# Patient Record
Sex: Female | Born: 1970
Health system: Southern US, Community
[De-identification: ages and names within clinical notes are randomized; demographics above are authoritative.]

## PROBLEM LIST (undated history)

## (undated) DIAGNOSIS — J45909 Unspecified asthma, uncomplicated: Secondary | ICD-10-CM

## (undated) DIAGNOSIS — F29 Unspecified psychosis not due to a substance or known physiological condition: Secondary | ICD-10-CM

## (undated) DIAGNOSIS — K529 Noninfective gastroenteritis and colitis, unspecified: Secondary | ICD-10-CM

## (undated) DIAGNOSIS — F32A Depression, unspecified: Secondary | ICD-10-CM

## (undated) DIAGNOSIS — R519 Headache, unspecified: Secondary | ICD-10-CM

## (undated) DIAGNOSIS — F419 Anxiety disorder, unspecified: Secondary | ICD-10-CM

## (undated) DIAGNOSIS — R7303 Prediabetes: Secondary | ICD-10-CM

## (undated) DIAGNOSIS — K5792 Diverticulitis of intestine, part unspecified, without perforation or abscess without bleeding: Secondary | ICD-10-CM

## (undated) DIAGNOSIS — R569 Unspecified convulsions: Secondary | ICD-10-CM

## (undated) DIAGNOSIS — Z8041 Family history of malignant neoplasm of ovary: Secondary | ICD-10-CM

## (undated) DIAGNOSIS — M797 Fibromyalgia: Secondary | ICD-10-CM

## (undated) DIAGNOSIS — T7840XA Allergy, unspecified, initial encounter: Secondary | ICD-10-CM

## (undated) HISTORY — DX: Unspecified convulsions: R56.9

## (undated) HISTORY — DX: Diverticulitis of intestine, part unspecified, without perforation or abscess without bleeding: K57.92

## (undated) HISTORY — DX: Family history of malignant neoplasm of ovary: Z80.41

## (undated) HISTORY — DX: Anxiety disorder, unspecified: F41.9

## (undated) HISTORY — DX: Allergy, unspecified, initial encounter: T78.40XA

## (undated) HISTORY — PX: BACK SURGERY: SHX140

## (undated) HISTORY — DX: Headache, unspecified: R51.9

---

## 2007-10-29 HISTORY — PX: LAMINECTOMY AND MICRODISCECTOMY THORACIC SPINE: SHX1915

## 2010-02-27 HISTORY — PX: ULNAR NERVE REPAIR: SHX2594

## 2010-11-28 HISTORY — PX: BACK SURGERY: SHX140

## 2011-01-24 DIAGNOSIS — F17200 Nicotine dependence, unspecified, uncomplicated: Secondary | ICD-10-CM | POA: Insufficient documentation

## 2011-04-05 DIAGNOSIS — Z7689 Persons encountering health services in other specified circumstances: Secondary | ICD-10-CM | POA: Insufficient documentation

## 2018-04-17 DIAGNOSIS — Z9889 Other specified postprocedural states: Secondary | ICD-10-CM | POA: Insufficient documentation

## 2020-02-28 DIAGNOSIS — M4802 Spinal stenosis, cervical region: Secondary | ICD-10-CM

## 2020-02-28 DIAGNOSIS — M51369 Other intervertebral disc degeneration, lumbar region without mention of lumbar back pain or lower extremity pain: Secondary | ICD-10-CM

## 2020-02-28 DIAGNOSIS — M5030 Other cervical disc degeneration, unspecified cervical region: Secondary | ICD-10-CM

## 2020-02-28 HISTORY — DX: Other cervical disc degeneration, unspecified cervical region: M50.30

## 2020-02-28 HISTORY — DX: Other intervertebral disc degeneration, lumbar region without mention of lumbar back pain or lower extremity pain: M51.369

## 2020-02-28 HISTORY — DX: Spinal stenosis, cervical region: M48.02

## 2020-05-03 ENCOUNTER — Emergency Department
Admission: EM | Admit: 2020-05-03 | Discharge: 2020-05-04 | Disposition: A | Discharge status: Home / Self Care | Payer: PRIVATE HEALTH INSURANCE | Attending: Emergency Medicine | Admitting: Emergency Medicine

## 2020-05-03 ENCOUNTER — Encounter: Payer: Self-pay | Admitting: Emergency Medicine

## 2020-05-03 ENCOUNTER — Other Ambulatory Visit: Payer: Self-pay

## 2020-05-03 DIAGNOSIS — R1032 Left lower quadrant pain: Secondary | ICD-10-CM | POA: Diagnosis not present

## 2020-05-03 DIAGNOSIS — R109 Unspecified abdominal pain: Secondary | ICD-10-CM

## 2020-05-03 DIAGNOSIS — R110 Nausea: Secondary | ICD-10-CM | POA: Insufficient documentation

## 2020-05-03 HISTORY — DX: Noninfective gastroenteritis and colitis, unspecified: K52.9

## 2020-05-03 LAB — CBC
HCT: 44.6 % (ref 36.0–46.0)
Hemoglobin: 14.9 g/dL (ref 12.0–15.0)
MCH: 29.6 pg (ref 26.0–34.0)
MCHC: 33.4 g/dL (ref 30.0–36.0)
MCV: 88.5 fL (ref 80.0–100.0)
Platelets: 276 10*3/uL (ref 150–400)
RBC: 5.04 MIL/uL (ref 3.87–5.11)
RDW: 13.4 % (ref 11.5–15.5)
WBC: 13.1 10*3/uL — ABNORMAL HIGH (ref 4.0–10.5)
nRBC: 0 % (ref 0.0–0.2)

## 2020-05-03 LAB — URINALYSIS, COMPLETE (UACMP) WITH MICROSCOPIC
Bacteria, UA: NONE SEEN
Bilirubin Urine: NEGATIVE
Glucose, UA: NEGATIVE mg/dL
Ketones, ur: NEGATIVE mg/dL
Nitrite: NEGATIVE
Protein, ur: 30 mg/dL — AB
Specific Gravity, Urine: 1.011 (ref 1.005–1.030)
pH: 5 (ref 5.0–8.0)

## 2020-05-03 LAB — COMPREHENSIVE METABOLIC PANEL
ALT: 20 U/L (ref 0–44)
AST: 22 U/L (ref 15–41)
Albumin: 4 g/dL (ref 3.5–5.0)
Alkaline Phosphatase: 69 U/L (ref 38–126)
Anion gap: 9 (ref 5–15)
BUN: 7 mg/dL (ref 6–20)
CO2: 23 mmol/L (ref 22–32)
Calcium: 8.8 mg/dL — ABNORMAL LOW (ref 8.9–10.3)
Chloride: 105 mmol/L (ref 98–111)
Creatinine, Ser: 0.68 mg/dL (ref 0.44–1.00)
GFR, Estimated: >60 mL/min (ref >60)
Glucose, Bld: 104 mg/dL — ABNORMAL HIGH (ref 70–99)
Potassium: 3.5 mmol/L (ref 3.5–5.1)
Sodium: 137 mmol/L (ref 135–145)
Total Bilirubin: 0.6 mg/dL (ref 0.3–1.2)
Total Protein: 7.4 g/dL (ref 6.5–8.1)

## 2020-05-03 MED ORDER — ONDANSETRON HCL 4 MG/2ML IJ SOLN
4.0000 mg | Freq: Once | INTRAMUSCULAR | Status: AC
  Administered 2020-05-04: 4 mg via INTRAVENOUS
  Filled 2020-05-03: qty 2

## 2020-05-03 MED ORDER — MORPHINE SULFATE (PF) 4 MG/ML IV SOLN
4.0000 mg | Freq: Once | INTRAVENOUS | Status: AC
  Administered 2020-05-04: 4 mg via INTRAVENOUS
  Filled 2020-05-03: qty 1

## 2020-05-03 MED ORDER — SODIUM CHLORIDE 0.9 % IV BOLUS (SEPSIS)
1000.0000 mL | Freq: Once | INTRAVENOUS | Status: AC
  Administered 2020-05-04: 1000 mL via INTRAVENOUS

## 2020-05-03 NOTE — ED Triage Notes (Addendum)
Pt in with co left flank pain since Saturday. States hx of kidney infections in the past but states feels different. Denies any dysuria or fever at home. Pt states she has also had some blood stools, hx of ulcerative colitis.

## 2020-05-03 NOTE — ED Provider Notes (Incomplete)
[facility] Emergency Department Provider Note  ____________________________________________   Event Date/Time   First MD Initiated Contact with Patient 05/03/20 2259     (approximate)  I have reviewed the triage vital signs and the nursing notes.   HISTORY  Chief Complaint Flank Pain    HPI Megan Hawkins is a 50 y.o. female with history of ulcerative colitis which she reports has been in remission since she was 50 years old who presents to the emergency department with complaints of left upper quadrant, left lower quadrant and left flank pain that she describes as sharp in nature.  Pain is worse with deep inspiration.  Pain starts in the left flank and radiates into the left abdomen.  She states it is severe in nature but waxes and wanes.  She has had associated nausea but no vomiting or diarrhea.  Reports her stools have appeared normal but she has seen a small amount of bright red blood in the toilet over the past couple of days.  No clots.  No melena.  Denies dysuria, hematuria, vaginal bleeding or discharge.  She denies any chest pain or shortness of breath.  No fever or cough.  No history of PE, DVT, exogenous estrogen use, recent fractures, surgery, trauma, hospitalization, prolonged travel or other immobilization. No lower extremity swelling or pain. No calf tenderness.    She reports she has had 2 previous thoracic spine surgeries, last was in 2012 in [place].  She states she has had a laminectomy, discectomy and then a stabilization.  She denies any numbness, tingling or focal weakness.  No bowel or bladder incontinence, urinary retention.  No history of recent injury to her back.  She reports history of previous C-section.        Past Medical History:  Diagnosis Date  . Colitis     There are no problems to display for this patient.   History reviewed. No pertinent surgical history.  Prior to Admission medications   Not on File     Allergies Sulfa antibiotics  History reviewed. No pertinent family history.  Social History    Review of Systems Constitutional: No fever. Eyes: No visual changes. ENT: No sore throat. Cardiovascular: Denies chest pain. Respiratory: Denies shortness of breath. Gastrointestinal: No vomiting, diarrhea. Genitourinary: Negative for dysuria. Musculoskeletal: Negative for back pain. Skin: Negative for rash. Neurological: Negative for focal weakness or numbness.  ____________________________________________   PHYSICAL EXAM:  VITAL SIGNS: ED Triage Vitals [05/03/20 2217]  Enc Vitals Group     BP (!) 149/75     Pulse Rate 96     Resp 20     Temp 98.5 F (36.9 C)     Temp Source Oral     SpO2 96 %     Weight 185 lb (83.9 kg)     Height 4\' 11"  (1.499 m)     Head Circumference      Peak Flow      Pain Score 7     Pain Loc      Pain Edu?      Excl. in [place]?    CONSTITUTIONAL: Alert and oriented and responds appropriately to questions. Well-appearing; well-nourished HEAD: Normocephalic EYES: Conjunctivae clear, pupils appear equal, EOM appear intact ENT: normal nose; moist mucous membranes NECK: Supple, normal ROM CARD: RRR; S1 and S2 appreciated; no murmurs, no clicks, no rubs, no gallops RESP: Normal chest excursion without splinting or tachypnea; breath sounds clear and equal bilaterally; no wheezes, no rhonchi, no  rales, no hypoxia or respiratory distress, speaking full sentences ABD/GI: Normal bowel sounds; non-distended; soft, tender throughout the left upper quadrant and minimally in the left lower quadrant, no rebound, no guarding, no peritoneal signs, no hepatosplenomegaly BACK: The back appears normal, no midline spinal tenderness or step-off or deformity, patient has some mild left CVA tenderness, no redness or warmth, soft tissue swelling or ecchymosis, rash or other lesions present to the back EXT: Normal ROM in all joints; no deformity noted, no edema; no  cyanosis SKIN: Normal color for age and race; warm; no rash on exposed skin NEURO: Moves all extremities equally, normal sensation in bilateral lower extremities, no saddle anesthesia, no hyperreflexia, 2+ deep tendon reflexes in bilateral lower extremities PSYCH: The patient's mood and manner are appropriate.  ____________________________________________   LABS (all labs ordered are listed, but only abnormal results are displayed)  Labs Reviewed  CBC - Abnormal; Notable for the following components:      Result Value   WBC 13.1 (*)    All other components within normal limits  COMPREHENSIVE METABOLIC PANEL - Abnormal; Notable for the following components:   Glucose, Bld 104 (*)    Calcium 8.8 (*)    All other components within normal limits  URINALYSIS, COMPLETE (UACMP) WITH MICROSCOPIC - Abnormal; Notable for the following components:   Color, Urine YELLOW (*)    APPearance HAZY (*)    Hgb urine dipstick SMALL (*)    Protein, ur 30 (*)    Leukocytes,Ua TRACE (*)    All other components within normal limits   ____________________________________________  EKG  *** ____________________________________________  RADIOLOGY I, Kristen Ward, personally viewed and evaluated these images (plain radiographs) as part of my medical decision making, as well as reviewing the written report by the radiologist.  ED MD interpretation:  ***  Official radiology report(s): No results found.  ____________________________________________   PROCEDURES  Procedure(s) performed (including Critical Care):  Procedures  CRITICAL CARE Performed by: Pryor Curia   Total critical care time: *** minutes  Critical care time was exclusive of separately billable procedures and treating other patients.  Critical care was necessary to treat or prevent imminent or life-threatening deterioration.  Critical care was time spent personally by me on the following activities: development of  treatment plan with patient and/or surrogate as well as nursing, discussions with consultants, evaluation of patient's response to treatment, examination of patient, obtaining history from patient or surrogate, ordering and performing treatments and interventions, ordering and review of laboratory studies, ordering and review of radiographic studies, pulse oximetry and re-evaluation of patient's condition.  ____________________________________________   INITIAL IMPRESSION / ASSESSMENT AND PLAN / ED COURSE  As part of my medical decision making, I reviewed the following data within the [place] {Mdm:[id number]::"Notes from prior ED visits","[place] Controlled Substance Database"}         Patient here with complaints of left-sided abdominal pain, left flank pain.  Differential includes kidney stone, pyelonephritis, colitis, diverticulitis.  Less likely appendicitis.  She does state there is increasing pain with deep inspiration but she denies having any chest pain or shortness of breath.  Will check EKG but low suspicion that this is her anginal equivalent.  She has no risk factors for PE and she is not tachycardic, tachypneic or hypoxic.  She does have history of previous back surgery but no red flag symptoms to suggest cauda equina, epidural abscess or hematoma, discitis or osteomyelitis, transverse myelitis.  She is neurologically intact at this  time.  Labs, urine obtained in triage unremarkable other than leukocytosis of 13,000.  We will proceed with CT imaging of her abdomen pelvis.  Will give IV fluids, pain and nausea medicine.  ED PROGRESS  *** ____________________________________________   FINAL CLINICAL IMPRESSION(S) / ED DIAGNOSES  Final diagnoses:  None     ED Discharge Orders    None      *Please note:  Megan Hawkins was evaluated in Emergency Department on 05/03/2020 for the symptoms described in the history of present illness. She was evaluated in the context of  the global COVID-19 pandemic, which necessitated consideration that the patient might be at risk for infection with the SARS-CoV-2 virus that causes COVID-19. Institutional protocols and algorithms that pertain to the evaluation of patients at risk for COVID-19 are in a state of rapid change based on information released by regulatory bodies including the CDC and federal and state organizations. These policies and algorithms were followed during the patient's care in the ED.  Some ED evaluations and interventions may be delayed as a result of limited staffing during and the pandemic.*   Note:  This document was prepared using Dragon voice recognition software and may include unintentional dictation errors.

## 2020-05-04 ENCOUNTER — Emergency Department: Payer: PRIVATE HEALTH INSURANCE

## 2020-05-04 MED ORDER — IOHEXOL 300 MG/ML  SOLN
100.0000 mL | Freq: Once | INTRAMUSCULAR | Status: AC | PRN
  Administered 2020-05-04: 100 mL via INTRAVENOUS

## 2020-05-04 MED ORDER — IBUPROFEN 800 MG PO TABS: 800.0000 mg | ORAL_TABLET | Freq: Three times a day (TID) | ORAL | 0 refills | Status: AC | PRN

## 2020-05-04 MED ORDER — HYDROCODONE-ACETAMINOPHEN 5-325 MG PO TABS: 2.0000 | ORAL_TABLET | Freq: Four times a day (QID) | ORAL | 0 refills | Status: DC | PRN

## 2020-05-04 MED ORDER — ONDANSETRON 4 MG PO TBDP: 4.0000 mg | ORAL_TABLET | Freq: Four times a day (QID) | ORAL | 0 refills | Status: DC | PRN

## 2020-05-04 NOTE — ED Notes (Signed)
Patient transported to CT 

## 2020-05-04 NOTE — ED Notes (Signed)
Pt agreeable with d/c plan as discussed by provider- this nurse has verbally reinforced d/c instructions and provided pt with written copy - pt acknowledges verbal understanding and denies any additional questions, concerns, needs.  Ambulatory at discharge independently with steady gait- awaits ride in front of ED entrance; ride enroute- pt declined to wait in lobby

## 2020-05-04 NOTE — ED Notes (Signed)
Late entry -- Pt has returned from [place] via stretcher; no acute changes noted.

## 2020-05-04 NOTE — Discharge Instructions (Addendum)
Your pain may be secondary to a pulled, strained muscle.  Your labs, urine, EKG and CT scan today were normal.  If you begin developing chest pain, shortness of breath, numbness or weakness in your extremities, inability to hold your bowel or bladder, inability to empty your bladder, fever of 100.4 or higher, vomiting and cannot stop, please return to the emergency department.  Steps to find a Primary Care Provider (PCP):  Call [phone] or [phone] to access "[place] [facility]."  2.  You may also go on the [facility] website at [email or web address]   You are being provided a prescription for opiates (also known as narcotics) for pain control.  Opiates can be addictive and should only be used when absolutely necessary for pain control when other alternatives do not work.  We recommend you only use them for the recommended amount of time and only as prescribed.  Please do not take with other sedative medications or alcohol.  Please do not drive, operate machinery, make important decisions while taking opiates.  Please note that these medications can be addictive and have high abuse potential.  Patients can become addicted to narcotics after only taking them for a few days.  Please keep these medications locked away from children, teenagers or any family members with history of substance abuse.  Narcotic pain medicine may also make you constipated.  You may use over-the-counter medications such as MiraLAX, Colace to prevent constipation.  If you become constipated you may use over-the-counter enemas as needed.  Itching and nausea are common side effects of narcotic pain medication.  If you develop uncontrolled vomiting or a rash, please stop these medications.

## 2020-05-04 NOTE — ED Provider Notes (Signed)
[facility] Emergency Department Provider Note  ____________________________________________   Event Date/Time   First MD Initiated Contact with Patient 05/03/20 2259     (approximate)  I have reviewed the triage vital signs and the nursing notes.   HISTORY  Chief Complaint Flank Pain    HPI Megan Hawkins is a 50 y.o. female with history of ulcerative colitis which she reports has been in remission since she was 50 years old who presents to the emergency department with complaints of left upper quadrant, left lower quadrant and left flank pain that she describes as sharp in nature.  Pain is worse with deep inspiration, turning to the left side.  Pain starts in the left flank and radiates into the left abdomen.  She states it is severe in nature but waxes and wanes.  She has had associated nausea but no vomiting or diarrhea.  Reports her stools have appeared normal but she has seen a small amount of bright red blood in the toilet over the past couple of days.  No clots.  No melena.  Denies dysuria, hematuria, vaginal bleeding or discharge.  She denies any chest pain or shortness of breath.  No fever or cough.  No history of PE, DVT, exogenous estrogen use, recent fractures, surgery, trauma, hospitalization, prolonged travel or other immobilization. No lower extremity swelling or pain. No calf tenderness. She reports she has had 2 previous thoracic spine surgeries, last was in 2012 in [place].  She states she has had a laminectomy, discectomy and then a stabilization.  She denies any numbness, tingling or focal weakness.  No bowel or bladder incontinence, urinary retention.  No history of recent injury to her back.  She reports history of previous C-section.        Past Medical History:  Diagnosis Date  . Colitis     There are no problems to display for this patient.   Past Surgical History:  Procedure Laterality Date  . CESAREAN SECTION      Prior to  Admission medications   Medication Sig Start Date End Date Taking? Authorizing Provider  HYDROcodone-acetaminophen (NORCO/VICODIN) 5-325 MG tablet Take 2 tablets by mouth every 6 (six) hours as needed. 05/04/20  Yes Daenerys Buttram, Delice Bison, DO  ibuprofen (ADVIL) 800 MG tablet Take 1 tablet (800 mg total) by mouth every 8 (eight) hours as needed for mild pain. 05/04/20  Yes Ariell Gunnels N, DO  ondansetron (ZOFRAN ODT) 4 MG disintegrating tablet Take 1 tablet (4 mg total) by mouth every 6 (six) hours as needed for nausea or vomiting. 05/04/20  Yes Sharleen Szczesny, Delice Bison, DO    Allergies Sulfa antibiotics  History reviewed. No pertinent family history.  Social History    Review of Systems Constitutional: No fever. Eyes: No visual changes. ENT: No sore throat. Cardiovascular: Denies chest pain. Respiratory: Denies shortness of breath. Gastrointestinal: No vomiting, diarrhea. Genitourinary: Negative for dysuria. Musculoskeletal: Negative for back pain. Skin: Negative for rash. Neurological: Negative for focal weakness or numbness.  ____________________________________________   PHYSICAL EXAM:  VITAL SIGNS: ED Triage Vitals [05/03/20 2217]  Enc Vitals Group     BP (!) 149/75     Pulse Rate 96     Resp 20     Temp 98.5 F (36.9 C)     Temp Source Oral     SpO2 96 %     Weight 185 lb (83.9 kg)     Height 4\' 11"  (1.499 m)     Head Circumference  Peak Flow      Pain Score 7     Pain Loc      Pain Edu?      Excl. in [place]?    CONSTITUTIONAL: Alert and oriented and responds appropriately to questions. Well-appearing; well-nourished HEAD: Normocephalic EYES: Conjunctivae clear, pupils appear equal, EOM appear intact ENT: normal nose; moist mucous membranes NECK: Supple, normal ROM CARD: RRR; S1 and S2 appreciated; no murmurs, no clicks, no rubs, no gallops RESP: Normal chest excursion without splinting or tachypnea; breath sounds clear and equal bilaterally; no wheezes, no rhonchi, no rales,  no hypoxia or respiratory distress, speaking full sentences ABD/GI: Normal bowel sounds; non-distended; soft, tender throughout the left upper quadrant and minimally in the left lower quadrant, no rebound, no guarding, no peritoneal signs, no hepatosplenomegaly BACK: The back appears normal, no midline spinal tenderness or step-off or deformity, patient has some mild left CVA tenderness, no redness or warmth, soft tissue swelling or ecchymosis, rash or other lesions present to the back EXT: Normal ROM in all joints; no deformity noted, no edema; no cyanosis SKIN: Normal color for age and race; warm; no rash on exposed skin NEURO: Moves all extremities equally, normal sensation in bilateral lower extremities, no saddle anesthesia, no hyperreflexia, 2+ deep tendon reflexes in bilateral lower extremities PSYCH: The patient's mood and manner are appropriate.  ____________________________________________   LABS (all labs ordered are listed, but only abnormal results are displayed)  Labs Reviewed  CBC - Abnormal; Notable for the following components:      Result Value   WBC 13.1 (*)    All other components within normal limits  COMPREHENSIVE METABOLIC PANEL - Abnormal; Notable for the following components:   Glucose, Bld 104 (*)    Calcium 8.8 (*)    All other components within normal limits  URINALYSIS, COMPLETE (UACMP) WITH MICROSCOPIC - Abnormal; Notable for the following components:   Color, Urine YELLOW (*)    APPearance HAZY (*)    Hgb urine dipstick SMALL (*)    Protein, ur 30 (*)    Leukocytes,Ua TRACE (*)    All other components within normal limits   ____________________________________________  EKG   EKG Interpretation  Date/Time:  Monday May 03 2020 23:56:52 EST Ventricular Rate:  74 PR Interval:    QRS Duration: 90 QT Interval:  397 QTC Calculation: 441 R Axis:   66 Text Interpretation: Sinus rhythm No old tracing to compare Confirmed by Pryor Curia [phone]) on  05/04/2020 12:18:45 AM       ____________________________________________  RADIOLOGY Jessie Foot Diva Lemberger, personally viewed and evaluated these images (plain radiographs) as part of my medical decision making, as well as reviewing the written report by the radiologist.  ED MD interpretation: No acute abnormality seen on CT imaging.  Official radiology report(s): CT ABDOMEN PELVIS W CONTRAST  Result Date: 05/04/2020 CLINICAL DATA:  Left-sided abdominal pain for several days EXAM: CT ABDOMEN AND PELVIS WITH CONTRAST TECHNIQUE: Multidetector CT imaging of the abdomen and pelvis was performed using the standard protocol following bolus administration of intravenous contrast. CONTRAST:  142mL OMNIPAQUE IOHEXOL 300 MG/ML  SOLN COMPARISON:  None. FINDINGS: Lower chest: No acute abnormality. Hepatobiliary: No focal liver abnormality is seen. No gallstones, gallbladder wall thickening, or biliary dilatation. Pancreas: Unremarkable. No pancreatic ductal dilatation or surrounding inflammatory changes. Spleen: Normal in size without focal abnormality. Adrenals/Urinary Tract: Adrenal glands are within normal limits. Kidneys are well visualized bilaterally. No renal calculi or urinary tract obstructive changes are seen.  Bladder is well distended and within normal limits. Stomach/Bowel: Scattered diverticular change of the colon is noted without evidence of diverticulitis. No obstructive or inflammatory changes are seen. The appendix is within normal limits. Small bowel and stomach show no acute abnormality Vascular/Lymphatic: Aortic atherosclerosis. No enlarged abdominal or pelvic lymph nodes. Reproductive: Uterus and bilateral adnexa are unremarkable. Other: No abdominal wall hernia or abnormality. No abdominopelvic ascites. Musculoskeletal: Postsurgical changes are noted in the lower thoracic spine on the left. No acute bony abnormality is seen. IMPRESSION: Diverticulosis without diverticulitis. No other focal  abnormality is noted. Electronically Signed   By: Inez Catalina M.D.   On: 05/04/2020 00:51    ____________________________________________   PROCEDURES  Procedure(s) performed (including Critical Care):  Procedures    ____________________________________________   INITIAL IMPRESSION / ASSESSMENT AND PLAN / ED COURSE  As part of my medical decision making, I reviewed the following data within the [place] notes reviewed and incorporated, Labs reviewed, EKG interpreted NSR, CT reviewed, Notes from prior ED visits and [place] Controlled Substance Database         Patient here with complaints of left-sided abdominal pain, left flank pain.  Differential includes kidney stone, pyelonephritis, colitis, diverticulitis.  Less likely appendicitis.  She does state there is increasing pain with deep inspiration but she denies having any chest pain or shortness of breath.  Will check EKG but low suspicion that this is her anginal equivalent.  She has no risk factors for PE and she is not tachycardic, tachypneic or hypoxic.  She does have history of previous back surgery but no red flag symptoms to suggest cauda equina, epidural abscess or hematoma, discitis or osteomyelitis, transverse myelitis.  She is neurologically intact at this time.  Labs, urine obtained in triage unremarkable other than leukocytosis of 13,000.  We will proceed with CT imaging of her abdomen pelvis.  Will give IV fluids, pain and nausea medicine.  ED PROGRESS  Patient CT scan shows diverticulosis without diverticulitis.  Appendix appears normal.  No sign of colitis, bowel obstruction.  Postsurgical changes noted in the lower thoracic spine but no acute bony abnormality seen.  No kidney stones, pyelonephritis.  Lower lungs appear normal.  I suspect that her pain is musculoskeletal in nature.  Will discharge with pain medication.  She reports her pain is improved here.  Recommended follow-up with her primary  care doctor if symptoms or not improving.  We discussed return precautions.  She is comfortable with this plan and will have her boyfriend drive her home.  At this time, I do not feel there is any life-threatening condition present. I have reviewed, interpreted and discussed all results (EKG, imaging, lab, urine as appropriate) and exam findings with patient/family. I have reviewed nursing notes and appropriate previous records.  I feel the patient is safe to be discharged home without further emergent workup and can continue workup as an outpatient as needed. Discussed usual and customary return precautions. Patient/family verbalize understanding and are comfortable with this plan.  Outpatient follow-up has been provided as needed. All questions have been answered.  ____________________________________________   FINAL CLINICAL IMPRESSION(S) / ED DIAGNOSES  Final diagnoses:  Left flank pain     ED Discharge Orders         Ordered    HYDROcodone-acetaminophen (NORCO/VICODIN) 5-325 MG tablet  Every 6 hours PRN        05/04/20 0133    ibuprofen (ADVIL) 800 MG tablet  Every 8 hours PRN  05/04/20 0133    ondansetron (ZOFRAN ODT) 4 MG disintegrating tablet  Every 6 hours PRN        05/04/20 0133          *Please note:  Shay Jhaveri was evaluated in Emergency Department on 05/04/2020 for the symptoms described in the history of present illness. She was evaluated in the context of the global COVID-19 pandemic, which necessitated consideration that the patient might be at risk for infection with the SARS-CoV-2 virus that causes COVID-19. Institutional protocols and algorithms that pertain to the evaluation of patients at risk for COVID-19 are in a state of rapid change based on information released by regulatory bodies including the CDC and federal and state organizations. These policies and algorithms were followed during the patient's care in the ED.  Some ED evaluations and interventions  may be delayed as a result of limited staffing during and the pandemic.*   Note:  This document was prepared using Dragon voice recognition software and may include unintentional dictation errors.   Yalissa Fink, Delice Bison, DO 05/04/20 [phone]

## 2020-05-07 ENCOUNTER — Ambulatory Visit
Admission: EM | Admit: 2020-05-07 | Discharge: 2020-05-07 | Disposition: A | Discharge status: Home / Self Care | Payer: PRIVATE HEALTH INSURANCE | Attending: Family Medicine | Admitting: Family Medicine

## 2020-05-07 ENCOUNTER — Other Ambulatory Visit: Payer: Self-pay

## 2020-05-07 ENCOUNTER — Encounter: Payer: Self-pay | Admitting: Family Medicine

## 2020-05-07 DIAGNOSIS — K5900 Constipation, unspecified: Secondary | ICD-10-CM | POA: Diagnosis present

## 2020-05-07 DIAGNOSIS — R1084 Generalized abdominal pain: Secondary | ICD-10-CM | POA: Diagnosis present

## 2020-05-07 DIAGNOSIS — R109 Unspecified abdominal pain: Secondary | ICD-10-CM | POA: Diagnosis not present

## 2020-05-07 LAB — POCT URINALYSIS DIP (MANUAL ENTRY)
Bilirubin, UA: NEGATIVE
Glucose, UA: NEGATIVE mg/dL
Ketones, POC UA: NEGATIVE mg/dL
Leukocytes, UA: NEGATIVE
Nitrite, UA: NEGATIVE
Protein Ur, POC: NEGATIVE mg/dL
Spec Grav, UA: 1.01 (ref 1.010–1.025)
Urobilinogen, UA: 0.2 E.U./dL
pH, UA: 5.5 (ref 5.0–8.0)

## 2020-05-07 NOTE — Discharge Instructions (Addendum)
I believe that you are constipated Urine without infection here.  Try some miralax, increase water.  You can do the miralax morning and evening until a good bowel movement.  The hydrocodone will make you more constipated. Recommend discontinuing this If problem persists ir worsens please follow up.

## 2020-05-07 NOTE — ED Provider Notes (Signed)
Roderic Palau    CSN: [id number] Arrival date & time: 05/07/20  1207      History   Chief Complaint No chief complaint on file.   HPI Megan Hawkins is a 50 y.o. female.   Patient is a 2-year female that presents today with left flank pain with radiation to left upper quadrant and left lower abdominal discomfort.  This is been constant, waxing waning for the past 4 to 5 days.  Was seen in the emergency room on Monday and had work-up to include CT scan, blood work and urinalysis without any concerns.  CT showed diverticulosis without diverticulitis but no evidence of kidney stone.  She denies any urinary symptoms.  Reports that she has not had a bowel movement since last Sunday.  She has been taking Colace.  She is also been taking Zofran and hydrocodone that was prescribed in the hospital.  She is able to drink without any difficulties.  No vomiting or diarrhea.  No fevers. No chest pain, SOB.      Past Medical History:  Diagnosis Date  . Colitis     There are no problems to display for this patient.   Past Surgical History:  Procedure Laterality Date  . CESAREAN SECTION      OB History   No obstetric history on file.      Home Medications    Prior to Admission medications   Medication Sig Start Date End Date Taking? Authorizing Provider  HYDROcodone-acetaminophen (NORCO/VICODIN) 5-325 MG tablet Take 2 tablets by mouth every 6 (six) hours as needed. 05/04/20   Ward, Delice Bison, DO  ibuprofen (ADVIL) 800 MG tablet Take 1 tablet (800 mg total) by mouth every 8 (eight) hours as needed for mild pain. 05/04/20   Ward, Delice Bison, DO  ondansetron (ZOFRAN ODT) 4 MG disintegrating tablet Take 1 tablet (4 mg total) by mouth every 6 (six) hours as needed for nausea or vomiting. 05/04/20   Ward, Delice Bison, DO    Family History History reviewed. No pertinent family history.  Social History     Allergies   Sulfa antibiotics   Review of Systems Review of  Systems   Physical Exam Triage Vital Signs ED Triage Vitals [05/07/20 1242]  Enc Vitals Group     BP 113/75     Pulse Rate 95     Resp 18     Temp 98.2 F (36.8 C)     Temp Source Oral     SpO2 96 %     Weight      Height      Head Circumference      Peak Flow      Pain Score      Pain Loc      Pain Edu?      Excl. in [place]?    No data found.  Updated Vital Signs BP 113/75   Pulse 95   Temp 98.2 F (36.8 C) (Oral)   Resp 18   SpO2 96%   Visual Acuity Right Eye Distance:   Left Eye Distance:   Bilateral Distance:    Right Eye Near:   Left Eye Near:    Bilateral Near:     Physical Exam Vitals and nursing note reviewed.  Constitutional:      General: She is not in acute distress.    Appearance: Normal appearance. She is not ill-appearing, toxic-appearing or diaphoretic.  HENT:     Head: Normocephalic.     Nose:  Nose normal.     Mouth/Throat:     Pharynx: Oropharynx is clear.  Eyes:     Conjunctiva/sclera: Conjunctivae normal.  Pulmonary:     Effort: Pulmonary effort is normal.  Abdominal:     General: Abdomen is protuberant. Bowel sounds are normal. There is no distension.     Palpations: Abdomen is soft. There is no hepatomegaly or splenomegaly.     Tenderness: There is generalized abdominal tenderness and tenderness in the left upper quadrant. There is left CVA tenderness. There is no guarding or rebound.     Hernia: No hernia is present.  Musculoskeletal:        General: Normal range of motion.     Cervical back: Normal range of motion.  Skin:    General: Skin is warm and dry.     Findings: No rash.  Neurological:     Mental Status: She is alert.  Psychiatric:        Mood and Affect: Mood normal.      UC Treatments / Results  Labs (all labs ordered are listed, but only abnormal results are displayed) Labs Reviewed  POCT URINALYSIS DIP (MANUAL ENTRY) - Abnormal; Notable for the following components:      Result Value   Blood, UA small (*)     All other components within normal limits  URINE CULTURE    EKG   Radiology No results found.  Procedures Procedures (including critical care time)  Medications Ordered in UC Medications - No data to display  Initial Impression / Assessment and Plan / UC Course  I have reviewed the triage vital signs and the nursing notes.  Pertinent labs & imaging results that were available during my care of the patient were reviewed by me and considered in my medical decision making (see chart for details).     Flank pain with generalized abdominal discomfort and constipation. Urine without any concern for infection today.  Patient has already had a work-up in the ER to include CT scan and blood work on Monday which was negative for anything concerning.  Most likely symptoms related to constipation.  Recommended MiraLAX 2 times a day or daily until good bowel movement.  Increase water intake.  Recommended DC the hydrocodone which she reports she has finished.  Strict return and ER precautions given.  Final Clinical Impressions(s) / UC Diagnoses   Final diagnoses:  Flank pain  Generalized abdominal pain  Constipation, unspecified constipation type     Discharge Instructions     I believe that you are constipated Urine without infection here.  Try some miralax, increase water.  You can do the miralax morning and evening until a good bowel movement.  The hydrocodone will make you more constipated. Recommend discontinuing this If problem persists ir worsens please follow up.    ED Prescriptions    None     PDMP not reviewed this encounter.   Orvan July, NP 05/07/20 1417

## 2020-05-07 NOTE — ED Triage Notes (Signed)
Triaged by provider  

## 2020-05-09 LAB — URINE CULTURE: Culture: NO GROWTH

## 2020-05-13 ENCOUNTER — Ambulatory Visit: Payer: Self-pay | Admitting: Internal Medicine

## 2020-07-27 ENCOUNTER — Emergency Department: Payer: PRIVATE HEALTH INSURANCE

## 2020-07-27 ENCOUNTER — Other Ambulatory Visit: Payer: Self-pay

## 2020-07-27 ENCOUNTER — Emergency Department
Admission: EM | Admit: 2020-07-27 | Discharge: 2020-07-27 | Disposition: A | Discharge status: Home / Self Care | Payer: PRIVATE HEALTH INSURANCE | Attending: Emergency Medicine | Admitting: Emergency Medicine

## 2020-07-27 DIAGNOSIS — R42 Dizziness and giddiness: Secondary | ICD-10-CM | POA: Insufficient documentation

## 2020-07-27 DIAGNOSIS — R110 Nausea: Secondary | ICD-10-CM | POA: Insufficient documentation

## 2020-07-27 DIAGNOSIS — R002 Palpitations: Secondary | ICD-10-CM | POA: Insufficient documentation

## 2020-07-27 DIAGNOSIS — F17210 Nicotine dependence, cigarettes, uncomplicated: Secondary | ICD-10-CM | POA: Insufficient documentation

## 2020-07-27 LAB — PROTIME-INR
INR: 1 (ref 0.8–1.2)
Prothrombin Time: 13.3 seconds (ref 11.4–15.2)

## 2020-07-27 LAB — BASIC METABOLIC PANEL
Anion gap: 9 (ref 5–15)
BUN: 10 mg/dL (ref 6–20)
CO2: 24 mmol/L (ref 22–32)
Calcium: 8.8 mg/dL — ABNORMAL LOW (ref 8.9–10.3)
Chloride: 106 mmol/L (ref 98–111)
Creatinine, Ser: 0.76 mg/dL (ref 0.44–1.00)
GFR, Estimated: >60 mL/min (ref >60)
Glucose, Bld: 116 mg/dL — ABNORMAL HIGH (ref 70–99)
Potassium: 3.8 mmol/L (ref 3.5–5.1)
Sodium: 139 mmol/L (ref 135–145)

## 2020-07-27 LAB — CBC
HCT: 45.1 % (ref 36.0–46.0)
Hemoglobin: 15.5 g/dL — ABNORMAL HIGH (ref 12.0–15.0)
MCH: 29.2 pg (ref 26.0–34.0)
MCHC: 34.4 g/dL (ref 30.0–36.0)
MCV: 85.1 fL (ref 80.0–100.0)
Platelets: 259 10*3/uL (ref 150–400)
RBC: 5.3 MIL/uL — ABNORMAL HIGH (ref 3.87–5.11)
RDW: 13.2 % (ref 11.5–15.5)
WBC: 9.2 10*3/uL (ref 4.0–10.5)
nRBC: 0 % (ref 0.0–0.2)

## 2020-07-27 LAB — TROPONIN I (HIGH SENSITIVITY): Troponin I (High Sensitivity): 3 ng/L (ref <18)

## 2020-07-27 NOTE — ED Provider Notes (Signed)
[facility] Emergency Department Provider Note ____________________________________________   Event Date/Time   First MD Initiated Contact with Patient 07/27/20 1035     (approximate)  I have reviewed the triage vital signs and the nursing notes.  HISTORY  Chief Complaint Palpitations   HPI Megan Hawkins is a 50 y.o. femalewho presents to the ED for evaluation of palpitations.   Chart review indicates hx obesity.   Patient presents to the ED for evaluation of 3 days of palpitations.  Patient reports starting a new job 2 weeks ago, has been having less sleep the past week or 2.  Reports drinking typical 1 cup of coffee every morning without changes to caffeine intake, denies additional stimulants.  Reports 2 days of persistent palpitations without chest pain, syncope, shortness of breath, fever, emesis or abdominal pain.  Reports going to an urgent care this morning and be directed to the ED for further evaluation.  She shows me the EKG done at urgent care which shows a sinus rhythm with sinus arrhythmia, rate of 82 bpm.  Of note, first nurse notes that it showed A. fib and it does not.   Past Medical History:  Diagnosis Date  . Colitis     There are no problems to display for this patient.   Past Surgical History:  Procedure Laterality Date  . CESAREAN SECTION      Prior to Admission medications   Medication Sig Start Date End Date Taking? Authorizing Provider  ibuprofen (ADVIL) 800 MG tablet Take 1 tablet (800 mg total) by mouth every 8 (eight) hours as needed for mild pain. 05/04/20   Ward, Delice Bison, DO    Allergies Sulfa antibiotics  No family history on file.  Social History Social History   Tobacco Use  . Smoking status: Current Every Day Smoker    Types: Cigarettes  . Smokeless tobacco: Never Used  Substance Use Topics  . Alcohol use: Not Currently  . Drug use: Not Currently    Review of Systems  Constitutional: No  fever/chills Eyes: No visual changes. ENT: No sore throat. Cardiovascular: Denies chest pain.  Positive for palpitations Respiratory: Denies shortness of breath. Gastrointestinal: No abdominal pain.  , no vomiting.  No diarrhea.  No constipation. Genitourinary: Negative for dysuria. Musculoskeletal: Negative for back pain. Skin: Negative for rash. Neurological: Negative for headaches, focal weakness or numbness.  ____________________________________________   PHYSICAL EXAM:  VITAL SIGNS: Vitals:   07/27/20 1029  BP: 128/64  Pulse: 88  Resp: 15  Temp: 98.1 F (36.7 C)  SpO2: 98%     Constitutional: Alert and oriented. Well appearing and in no acute distress.  Sitting up on the foot of the bed, conversational and well-appearing. Eyes: Conjunctivae are normal. PERRL. EOMI. Head: Atraumatic. Nose: No congestion/rhinnorhea. Mouth/Throat: Mucous membranes are moist.  Oropharynx non-erythematous. Neck: No stridor. No cervical spine tenderness to palpation. Cardiovascular: Normal rate, regular rhythm. Grossly normal heart sounds.  Good peripheral circulation. Respiratory: Normal respiratory effort.  No retractions. Lungs CTAB. Gastrointestinal: Soft , nondistended, nontender to palpation. No CVA tenderness. Musculoskeletal: No lower extremity tenderness nor edema.  No joint effusions. No signs of acute trauma. Neurologic:  Normal speech and language. No gross focal neurologic deficits are appreciated. No gait instability noted. Skin:  Skin is warm, dry and intact. No rash noted. Psychiatric: Mood and affect are normal. Speech and behavior are normal.  ____________________________________________   LABS (all labs ordered are listed, but only abnormal results are displayed)  Labs  Reviewed  BASIC METABOLIC PANEL - Abnormal; Notable for the following components:      Result Value   Glucose, Bld 116 (*)    Calcium 8.8 (*)    All other components within normal limits  CBC -  Abnormal; Notable for the following components:   RBC 5.30 (*)    Hemoglobin 15.5 (*)    All other components within normal limits  PROTIME-INR  MAGNESIUM  POC URINE PREG, ED  TROPONIN I (HIGH SENSITIVITY)   ____________________________________________  12 Lead EKG  Sinus rhythm, rate of 84 bpm.  Normal axis and intervals.  No evidence of acute ischemia. ____________________________________________  RADIOLOGY  ED MD interpretation: 2 view CXR reviewed by me without evidence of acute cardiopulmonary pathology.  Official radiology report(s): DG Chest 2 View  Result Date: 07/27/2020 CLINICAL DATA:  50 year old female with palpitations. Nausea and dizziness. Smoker. EXAM: CHEST - 2 VIEW COMPARISON:  CT Abdomen and Pelvis 05/04/2020. FINDINGS: Normal cardiac size and mediastinal contours. Normal lung volumes. Mild diffuse increased pulmonary interstitial markings, with subtle attenuation of bronchovascular markings in the upper lobes. No pneumothorax, pleural effusion, pulmonary edema or confluent pulmonary opacity. Redemonstrated unilateral left lower thoracic pedicle screw fixation. No acute osseous abnormality identified. Negative visible bowel gas pattern. IMPRESSION: Probable smoking related mild diffuse interstitial prominence. No acute cardiopulmonary abnormality. Electronically Signed   By: Genevie Ann M.D.   On: 07/27/2020 10:51    ____________________________________________   PROCEDURES and INTERVENTIONS  Procedure(s) performed (including Critical Care):  .1-3 Lead EKG Interpretation Performed by: Vladimir Crofts, MD Authorized by: Vladimir Crofts, MD     Interpretation: normal     ECG rate:  80   ECG rate assessment: normal     Rhythm: sinus rhythm     Ectopy: none     Conduction: normal      Medications - No data to display  ____________________________________________   MDM / ED COURSE   50 year old woman presents to the ED with isolated palpitations, without  evidence of acute pathology, and amenable to outpatient management.  Normal vitals on room air.  Exam demonstrates a well-appearing woman without evidence of acute derangements.  EKG demonstrates normal sinus rhythm without ischemic changes.  Troponin is negative and electrolytes are WNL.  Likely related to her poor sleep in the setting of her new job and associated stressors.  We discussed appropriate sleep hygiene, provided information to establish with a local PCP, and we discussed return precautions for the ED.  Patient stable for outpatient management.      ____________________________________________   FINAL CLINICAL IMPRESSION(S) / ED DIAGNOSES  Final diagnoses:  Palpitations     ED Discharge Orders    None       Megan Hawkins   Note:  This document was prepared using Dragon voice recognition software and may include unintentional dictation errors.   Vladimir Crofts, MD 07/27/20 [phone]

## 2020-07-27 NOTE — ED Notes (Signed)
See triage note  Presents with some nausea and dizziness  Also has had some intermittent heart palpitations  EKG on arrival shows NSR

## 2020-07-27 NOTE — ED Triage Notes (Signed)
Pt sent from [facility] with c/o having heart palpitations for the past 2-3 days, denies pain, SOB or diaphoresis.Megan Hawkins pt is in NAD at present.

## 2020-07-27 NOTE — Discharge Instructions (Signed)
This may be related to poor sleep in the setting of your new job.  No signs of strain or damage to your heart.  If you develop palpitations with chest pain, palpitations with passing out or difficulty breathing, please return to the ED.

## 2020-07-27 NOTE — ED Triage Notes (Signed)
First Nurse Note:  Seen through Urgent Care this morning for C/O heart palpitations.  12-lead done, patient brought copy, shows Afib 82.

## 2020-09-27 ENCOUNTER — Ambulatory Visit
Admission: EM | Admit: 2020-09-27 | Discharge: 2020-09-27 | Disposition: A | Discharge status: Home / Self Care | Payer: BC Managed Care – PPO

## 2020-09-27 ENCOUNTER — Telehealth: Payer: Self-pay | Admitting: Emergency Medicine

## 2020-09-27 ENCOUNTER — Encounter: Payer: Self-pay | Admitting: Emergency Medicine

## 2020-09-27 ENCOUNTER — Other Ambulatory Visit: Payer: Self-pay

## 2020-09-27 DIAGNOSIS — R599 Enlarged lymph nodes, unspecified: Secondary | ICD-10-CM | POA: Diagnosis not present

## 2020-09-27 DIAGNOSIS — Z760 Encounter for issue of repeat prescription: Secondary | ICD-10-CM

## 2020-09-27 DIAGNOSIS — H6691 Otitis media, unspecified, right ear: Secondary | ICD-10-CM

## 2020-09-27 HISTORY — DX: Unspecified asthma, uncomplicated: J45.909

## 2020-09-27 MED ORDER — CEFDINIR 300 MG PO CAPS: 300.0000 mg | ORAL_CAPSULE | Freq: Two times a day (BID) | ORAL | 0 refills | Status: AC

## 2020-09-27 MED ORDER — ALBUTEROL SULFATE HFA 108 (90 BASE) MCG/ACT IN AERS: 1.0000 | INHALATION_SPRAY | Freq: Four times a day (QID) | RESPIRATORY_TRACT | 0 refills | Status: DC | PRN

## 2020-09-27 MED ORDER — CEFDINIR 300 MG PO CAPS: 300.0000 mg | ORAL_CAPSULE | Freq: Two times a day (BID) | ORAL | 0 refills | Status: DC

## 2020-09-27 NOTE — ED Triage Notes (Signed)
Patient c/o bilateral ear pain and lymph node swelling x 1 week.   Patient denies fever.   Patient endorses increased pain on the RT posterior side of neck.   Patient endorses onset of symptoms began with "feeling like something was stuck in my throat and sore throat".   History of Allergy Related Asthma.   Patient hasn't taken any medications for symptoms.

## 2020-09-27 NOTE — Discharge Instructions (Addendum)
Follow-up with PCP after course of antibiotics have been completed for reassessment to ensure clearance of infection to right ear.  You may use Tylenol or ibuprofen as needed for fever or pain as long as neither of these medications are contraindicated to any current health conditions. Finish entire prescription of antibiotics.  Do not discontinue taking this medication when you begin to feel better.  Complete full course. Make sure that you eat with each dose of antibiotics to decrease stomach upset. Eat a daily [place] yogurt or take a daily probiotic to help with stomach upset from antibiotic use. Alternate warm and cool compresses to right ear to help with discomfort. Resting will help the body to fight infection.  Ensure that you receive adequate rest and aim for 8 hours of sleep nightly while recovering from illness.  If you begin to notice worsening of symptoms such as new high fever, worsening ear pain, redness or swelling behind your ear, new or worse discharge from your ear, difficulty hearing or if you are not beginning to feel better after 2 to 3 days return to clinic for further evaluation.  If you begin to notice dizziness, severe headache or confusion go to the ER for evaluation.

## 2020-09-27 NOTE — ED Provider Notes (Signed)
CHIEF COMPLAINT:   Chief Complaint  Patient presents with   Ear Pain   Lymphadenopathy     SUBJECTIVE/HPI:  HPI A very pleasant 50 y.o.Female presents today with right ear pain and swollen lymph node for which she noticed about 1 week ago.  Patient reports that she is also out of her Ventolin inhaler and would like a refill.  She states that she has allergy related asthma which is intermittent, but is unable to get in with her PCP for the next 3 to 4 months.  She does not report any known sick contacts, fever, chills, headache, vomiting.   has a past medical history of Asthma and Colitis.  ROS:  Review of Systems See Subjective/HPI Medications, Allergies and Problem List personally reviewed in Epic today OBJECTIVE:   Vitals:   09/27/20 1402  BP: 113/78  Pulse: 95  Resp: 13  Temp: 98.8 F (37.1 C)  SpO2: 95%    Physical Exam   General: Appears well-developed and well-nourished. No acute distress.  HEENT Head: Normocephalic and atraumatic.   Ears: Hearing grossly intact, no drainage or visible deformity.  Right TM bulging and erythematous.  Left TM WNL. Nose: No nasal deviation.   Mouth/Throat: No stridor or tracheal deviation.   Eyes: Conjunctivae and EOM are normal. No eye drainage or scleral icterus bilaterally.  Neck: Normal range of motion, neck is supple. + Right posterior cervical lymph node palpated.  Soft, mobile and mildly tender Cardiovascular: Normal rate. Regular rhythm; no murmurs, gallops, or rubs.  Pulm/Chest: No respiratory distress. Breath sounds normal bilaterally without wheezes, rhonchi, or rales.  Neurological: Alert and oriented to person, place, and time.  Skin: Skin is warm and dry.  No rashes, lesions, abrasions or bruising noted to skin.   Psychiatric: Normal mood, affect, behavior, and thought content.   Vital signs and nursing note reviewed.   Patient stable and cooperative with examination. PROCEDURES:    LABS/X-RAYS/EKG/MEDS:   No  results found for any visits on 09/27/20.  MEDICAL DECISION MAKING:   Patient presents with right ear pain and swollen lymph node for which she noticed about 1 week ago.  Patient reports that she is also out of her Ventolin inhaler and would like a refill.  She states that she has allergy related asthma which is intermittent, but is unable to get in with her PCP for the next 3 to 4 months.  She does not report any known sick contacts, fever, chills, headache, vomiting.  Chart review completed.  Given symptoms along with assessment findings, likely right otitis media, Rx'd cefdinir to the patient's preferred pharmacy and advised about home treatment and care as outlined in her AVS to include Tylenol and ibuprofen for discomfort.  Also advised to follow-up with her PCP for reassessment to the right ear to ensure clearance of infection.  There is also a mildly swollen lymph node to the right posterior aspect of the patient's neck which is soft and mobile.  No concern at this time for any ominous etiology for this lymph node swelling considering that this is on the same side as her ear infection.  Did refill the patient's albuterol inhaler for her allergy induced asthma as requested.  Return as needed.  Patient verbalized understanding and agreed with treatment plan.  Patient stable upon discharge. ASSESSMENT/PLAN:  1. Acute right otitis media  2. Swelling of lymph node  3. Medication refill Meds ordered this encounter  Medications   cefdinir (OMNICEF) 300 MG capsule  Sig: Take 1 capsule (300 mg total) by mouth 2 (two) times daily for 7 days.    Dispense:  14 capsule    Refill:  0    Order Specific Question:   Supervising Provider    Answer:   Bari Mantis   albuterol (VENTOLIN HFA) 108 (90 Base) MCG/ACT inhaler    Sig: Inhale 1-2 puffs into the lungs every 6 (six) hours as needed for wheezing or shortness of breath.    Dispense:  6.7 g    Refill:  0    Order Specific Question:    Supervising Provider    Answer:   Chase Picket [id number]    Instructions about new medications and side effects provided.  Plan:   Discharge Instructions      Follow-up with PCP after course of antibiotics have been completed for reassessment to ensure clearance of infection to right ear.  You may use Tylenol or ibuprofen as needed for fever or pain as long as neither of these medications are contraindicated to any current health conditions. Finish entire prescription of antibiotics.  Do not discontinue taking this medication when you begin to feel better.  Complete full course. Make sure that you eat with each dose of antibiotics to decrease stomach upset. Eat a daily [place] yogurt or take a daily probiotic to help with stomach upset from antibiotic use. Alternate warm and cool compresses to right ear to help with discomfort. Resting will help the body to fight infection.  Ensure that you receive adequate rest and aim for 8 hours of sleep nightly while recovering from illness.  If you begin to notice worsening of symptoms such as new high fever, worsening ear pain, redness or swelling behind your ear, new or worse discharge from your ear, difficulty hearing or if you are not beginning to feel better after 2 to 3 days return to clinic for further evaluation.  If you begin to notice dizziness, severe headache or confusion go to the ER for evaluation.          Serafina Royals, [place] 09/27/20 [phone]

## 2020-10-27 ENCOUNTER — Other Ambulatory Visit: Payer: Self-pay

## 2020-10-27 ENCOUNTER — Ambulatory Visit
Admission: EM | Admit: 2020-10-27 | Discharge: 2020-10-27 | Disposition: A | Discharge status: Home / Self Care | Payer: BC Managed Care – PPO | Attending: Emergency Medicine | Admitting: Emergency Medicine

## 2020-10-27 DIAGNOSIS — S70361A Insect bite (nonvenomous), right thigh, initial encounter: Secondary | ICD-10-CM

## 2020-10-27 DIAGNOSIS — L0291 Cutaneous abscess, unspecified: Secondary | ICD-10-CM

## 2020-10-27 DIAGNOSIS — W57XXXA Bitten or stung by nonvenomous insect and other nonvenomous arthropods, initial encounter: Secondary | ICD-10-CM

## 2020-10-27 MED ORDER — DOXYCYCLINE HYCLATE 100 MG PO CAPS: 100.0000 mg | ORAL_CAPSULE | Freq: Two times a day (BID) | ORAL | 0 refills | Status: AC

## 2020-10-27 NOTE — ED Triage Notes (Signed)
Pt states has a hard red raised area to inside rt upper leg x4 days. States ? Spider bite.

## 2020-10-27 NOTE — Discharge Instructions (Addendum)
Take the doxycycline as directed.    Keep your wound clean and dry.  Wash it gently twice a day with soap and water.  Apply an antibiotic cream twice a day.     Follow up with your primary care provider if your symptoms are not improving.    

## 2020-10-27 NOTE — ED Provider Notes (Signed)
Roderic Palau    CSN: [id number] Arrival date & time: 10/27/20  1736      History   Chief Complaint Chief Complaint  Patient presents with   Abscess    HPI Megan Hawkins is a 50 y.o. female.  Patient presents with red tender area on her right upper thigh x4 days.  She states she was sitting on her porch wearing shorts when she failed a sharp pain to the area; she believes she was bitten by an insect; she did not see what bit her.  She denies fever, chills, drainage from the wound, numbness, weakness, or other symptoms.  No treatments attempted at home.  Her medical history includes asthma and colitis.  The history is provided by the patient and medical records.   Past Medical History:  Diagnosis Date   Asthma    Colitis     There are no problems to display for this patient.   Past Surgical History:  Procedure Laterality Date   CESAREAN SECTION      OB History   No obstetric history on file.      Home Medications    Prior to Admission medications   Medication Sig Start Date End Date Taking? Authorizing Provider  doxycycline (VIBRAMYCIN) 100 MG capsule Take 1 capsule (100 mg total) by mouth 2 (two) times daily for 7 days. 10/27/20 11/03/20 Yes Sharion Balloon, NP  albuterol (VENTOLIN HFA) 108 (90 Base) MCG/ACT inhaler Inhale 1-2 puffs into the lungs every 6 (six) hours as needed for wheezing or shortness of breath. 09/27/20   Boddu, Erasmo Downer, FNP  ibuprofen (ADVIL) 800 MG tablet Take 1 tablet (800 mg total) by mouth every 8 (eight) hours as needed for mild pain. 05/04/20   Ward, Delice Bison, DO    Family History History reviewed. No pertinent family history.  Social History Social History   Tobacco Use   Smoking status: Every Day    Types: Cigarettes   Smokeless tobacco: Never  Substance Use Topics   Alcohol use: Not Currently   Drug use: Not Currently     Allergies   Sulfa antibiotics   Review of Systems Review of Systems  Constitutional:  Negative  for chills and fever.  Respiratory:  Negative for cough and shortness of breath.   Cardiovascular:  Negative for chest pain and palpitations.  Skin:  Positive for color change and wound.  Neurological:  Negative for weakness and numbness.  All other systems reviewed and are negative.   Physical Exam Triage Vital Signs ED Triage Vitals  Enc Vitals Group     BP      Pulse      Resp      Temp      Temp src      SpO2      Weight      Height      Head Circumference      Peak Flow      Pain Score      Pain Loc      Pain Edu?      Excl. in [place]?    No data found.  Updated Vital Signs BP 114/74 (BP Location: Left Arm)   Pulse 84   Temp 98.3 F (36.8 C) (Oral)   Resp 18   SpO2 95%   Visual Acuity Right Eye Distance:   Left Eye Distance:   Bilateral Distance:    Right Eye Near:   Left Eye Near:    Bilateral Near:  Physical Exam Vitals and nursing note reviewed.  Constitutional:      General: She is not in acute distress.    Appearance: She is well-developed. She is not ill-appearing.  HENT:     Head: Normocephalic and atraumatic.     Mouth/Throat:     Mouth: Mucous membranes are moist.  Eyes:     Conjunctiva/sclera: Conjunctivae normal.  Cardiovascular:     Rate and Rhythm: Normal rate and regular rhythm.     Heart sounds: Normal heart sounds.  Pulmonary:     Effort: Pulmonary effort is normal. No respiratory distress.     Breath sounds: Normal breath sounds.  Abdominal:     Palpations: Abdomen is soft.     Tenderness: There is no abdominal tenderness.  Musculoskeletal:        General: Normal range of motion.     Cervical back: Neck supple.  Skin:    General: Skin is warm and dry.     Findings: Erythema and lesion present.     Comments: Right upper thigh: 4 x 3 cm area of erythema with central 2 x 2 cm area of induration and open pustule.   Neurological:     General: No focal deficit present.     Mental Status: She is alert and oriented to person,  place, and time.     Gait: Gait normal.  Psychiatric:        Mood and Affect: Mood normal.        Behavior: Behavior normal.     UC Treatments / Results  Labs (all labs ordered are listed, but only abnormal results are displayed) Labs Reviewed - No data to display  EKG   Radiology No results found.  Procedures Procedures (including critical care time)  Medications Ordered in UC Medications - No data to display  Initial Impression / Assessment and Plan / UC Course  I have reviewed the triage vital signs and the nursing notes.  Pertinent labs & imaging results that were available during my care of the patient were reviewed by me and considered in my medical decision making (see chart for details).  Abscess of right thigh due to insect bite.  Treating with doxycycline.  Wound care instructions and signs of worsening infection discussed with patient.  Education provided on abscesses and insect bites.  Instructed patient to follow-up with her PCP if her symptoms are not improving.  Patient agrees to plan of care.   Final Clinical Impressions(s) / UC Diagnoses   Final diagnoses:  Abscess  Insect bite of right thigh, initial encounter     Discharge Instructions      Take the doxycycline as directed.  Keep your wound clean and dry.  Wash it gently twice a day with soap and water.  Apply an antibiotic cream twice a day.    Follow up with your primary care provider if your symptoms are not improving.          ED Prescriptions     Medication Sig Dispense Auth. Provider   doxycycline (VIBRAMYCIN) 100 MG capsule Take 1 capsule (100 mg total) by mouth 2 (two) times daily for 7 days. 14 capsule Sharion Balloon, NP      PDMP not reviewed this encounter.   Sharion Balloon, NP 10/27/20 [phone]

## 2020-11-09 ENCOUNTER — Other Ambulatory Visit: Payer: Self-pay

## 2020-11-09 ENCOUNTER — Emergency Department
Admission: EM | Admit: 2020-11-09 | Discharge: 2020-11-10 | Disposition: A | Discharge status: Home / Self Care | Payer: BC Managed Care – PPO | Attending: Emergency Medicine | Admitting: Emergency Medicine

## 2020-11-09 ENCOUNTER — Ambulatory Visit
Admission: EM | Admit: 2020-11-09 | Discharge: 2020-11-09 | Disposition: A | Discharge status: Home / Self Care | Payer: BC Managed Care – PPO | Attending: Emergency Medicine | Admitting: Emergency Medicine

## 2020-11-09 ENCOUNTER — Emergency Department: Payer: BC Managed Care – PPO

## 2020-11-09 ENCOUNTER — Encounter: Payer: Self-pay | Admitting: *Deleted

## 2020-11-09 ENCOUNTER — Encounter: Payer: Self-pay | Admitting: Emergency Medicine

## 2020-11-09 DIAGNOSIS — M47814 Spondylosis without myelopathy or radiculopathy, thoracic region: Secondary | ICD-10-CM | POA: Diagnosis not present

## 2020-11-09 DIAGNOSIS — Z20822 Contact with and (suspected) exposure to covid-19: Secondary | ICD-10-CM | POA: Insufficient documentation

## 2020-11-09 DIAGNOSIS — F17210 Nicotine dependence, cigarettes, uncomplicated: Secondary | ICD-10-CM | POA: Diagnosis not present

## 2020-11-09 DIAGNOSIS — R079 Chest pain, unspecified: Secondary | ICD-10-CM | POA: Diagnosis not present

## 2020-11-09 DIAGNOSIS — J45909 Unspecified asthma, uncomplicated: Secondary | ICD-10-CM | POA: Insufficient documentation

## 2020-11-09 DIAGNOSIS — R0902 Hypoxemia: Secondary | ICD-10-CM | POA: Diagnosis not present

## 2020-11-09 DIAGNOSIS — G4489 Other headache syndrome: Secondary | ICD-10-CM | POA: Diagnosis not present

## 2020-11-09 DIAGNOSIS — R519 Headache, unspecified: Secondary | ICD-10-CM

## 2020-11-09 DIAGNOSIS — R0789 Other chest pain: Secondary | ICD-10-CM | POA: Diagnosis not present

## 2020-11-09 LAB — CBC
HCT: 45.3 % (ref 36.0–46.0)
Hemoglobin: 16 g/dL — ABNORMAL HIGH (ref 12.0–15.0)
MCH: 30.5 pg (ref 26.0–34.0)
MCHC: 35.3 g/dL (ref 30.0–36.0)
MCV: 86.3 fL (ref 80.0–100.0)
Platelets: 248 10*3/uL (ref 150–400)
RBC: 5.25 MIL/uL — ABNORMAL HIGH (ref 3.87–5.11)
RDW: 13.2 % (ref 11.5–15.5)
WBC: 9.2 10*3/uL (ref 4.0–10.5)
nRBC: 0 % (ref 0.0–0.2)

## 2020-11-09 LAB — BASIC METABOLIC PANEL
Anion gap: 9 (ref 5–15)
BUN: 9 mg/dL (ref 6–20)
CO2: 24 mmol/L (ref 22–32)
Calcium: 8.5 mg/dL — ABNORMAL LOW (ref 8.9–10.3)
Chloride: 104 mmol/L (ref 98–111)
Creatinine, Ser: 0.73 mg/dL (ref 0.44–1.00)
GFR, Estimated: >60 mL/min (ref >60)
Glucose, Bld: 92 mg/dL (ref 70–99)
Potassium: 4 mmol/L (ref 3.5–5.1)
Sodium: 137 mmol/L (ref 135–145)

## 2020-11-09 LAB — TROPONIN I (HIGH SENSITIVITY)
Troponin I (High Sensitivity): 3 ng/L (ref <18)
Troponin I (High Sensitivity): 4 ng/L (ref <18)

## 2020-11-09 LAB — TSH: TSH: 3.007 u[IU]/mL (ref 0.350–4.500)

## 2020-11-09 LAB — T4, FREE: Free T4: 0.93 ng/dL (ref 0.61–1.12)

## 2020-11-09 NOTE — ED Notes (Signed)
EMS notified

## 2020-11-09 NOTE — ED Notes (Signed)
Dr Alphonzo Cruise giving report to EMS

## 2020-11-09 NOTE — ED Provider Notes (Signed)
HPI  SUBJECTIVE:  Megan Hawkins is a 50 y.o. female who presents with the acute onset of a severe headache on the top of her head waking her from sleep at 0330 yesterday.  Headache is now more like a band around her head.  She reports pressure in her ears, blurry vision, "seeing floaters", mild photophobia and phonophobia.  This is not the worst headache she has ever had.  No arm or leg weakness, numbness or tingling, facial droop, neck stiffness, vomiting, fevers.  She also reports constant substernal chest pain described as tightness, heaviness starting at the same time.  She states that pain radiates into her right neck and down her right arm.  She is not sure if the pain is coming from her headache or from her chest.  She reports nausea, diaphoresis, palpitations, dizziness described as lightheadedness.  Her chest pain gets worse with exertion and is better with rest/lying down.  She has tried Tylenol and ibuprofen for headache and chest pain without any improvement in her symptoms.  She is a smoker, has a history of asthma, migraines, obesity, TIA, no history of MI, diabetes, hypercholesterolemia, coronary disease, PAD/PVD.  Family history negative for MI.    Past Medical History:  Diagnosis Date   Asthma    Colitis     Past Surgical History:  Procedure Laterality Date   BACK SURGERY     CESAREAN SECTION      Family History  Problem Relation Age of Onset   Heart failure Father     Social History   Tobacco Use   Smoking status: Every Day    Types: Cigarettes   Smokeless tobacco: Never  Vaping Use   Vaping Use: Never used  Substance Use Topics   Alcohol use: Not Currently   Drug use: Not Currently    No current facility-administered medications for this encounter.  Current Outpatient Medications:    DOXYCYCLINE HYCLATE PO, Take by mouth., Disp: , Rfl:    albuterol (VENTOLIN HFA) 108 (90 Base) MCG/ACT inhaler, Inhale 1-2 puffs into the lungs every 6 (six) hours as  needed for wheezing or shortness of breath., Disp: 6.7 g, Rfl: 0   ibuprofen (ADVIL) 800 MG tablet, Take 1 tablet (800 mg total) by mouth every 8 (eight) hours as needed for mild pain., Disp: 30 tablet, Rfl: 0  Allergies  Allergen Reactions   Sulfa Antibiotics Other (See Comments)    Syncope      ROS  As noted in HPI.   Physical Exam  BP 120/83 (BP Location: Left Arm)   Pulse 85   Temp 98.5 F (36.9 C) (Oral)   Resp (!) 22   SpO2 95%   Constitutional: Well developed, well nourished, no acute distress Eyes: PERRL, EOMI, conjunctiva normal bilaterally.  No photophobia.  HENT: Normocephalic, atraumatic,mucus membranes moist.  No sinus tenderness.  Positive bilateral trapezial tenderness.  No meningismus. Respiratory: Clear to auscultation bilaterally, no rales, no wheezing, no rhonchi Cardiovascular: Normal rate and rhythm, no murmurs, no gallops, no rubs.  Positive tenderness over the sternum, this does not reproduce her chest pain GI: Nondistended skin: No rash, skin intact Musculoskeletal: No edema, no tenderness, no deformities Neurologic: Alert & oriented x 3, CN III-XII intact, no motor deficits, sensation grossly intact, tandem gait steady, fluent speech.  No pronator drift upper or lower extremities. Psychiatric: Speech and behavior appropriate   ED Course   Medications - No data to display  Orders Placed This Encounter  Procedures  EKG 12-Lead    Standing Status:   Standing    Number of Occurrences:   1   ED EKG    tightness    Standing Status:   Standing    Number of Occurrences:   1    Order Specific Question:   Reason for Exam    Answer:   Other (See Comments)   No results found for this or any previous visit (from the past 24 hour(s)). No results found.  ED Clinical Impression  1. Chest pain, unspecified type   2. Acute nonintractable headache, unspecified headache type      ED Assessment/Plan  EKG: Normal sinus rhythm, rate 84.  Normal axis,  normal intervals.  No hypertrophy.  No ST-T wave changes.  Patient symptomatic while EKG was obtained.  No change compared to EKG from 5/22.   Patient with chest pain and a headache.  patient's HEARt score is 4: One-point for moderately suspicious history, one-point for age and 2 points for risk factors. She is completely neurologically intact, doubt subarachnoid hemorrhage.  Wonder if she could have a migraine versus musculoskeletal headache.  It is difficult to ascertain whether the pain radiating to her neck and arm is from her chest or from her head.  Transferring to the ED via EMS for cardiac monitoring on route.  EMS arrived before aspirin was given.  Discussed rationale for transfer to the emergency department with patient and significant other.  They agree with plan  No orders of the defined types were placed in this encounter.     *This clinic note was created using Dragon dictation software. Therefore, there may be occasional mistakes despite careful proofreading. ?    Melynda Ripple, MD 11/09/20 2000

## 2020-11-09 NOTE — ED Triage Notes (Signed)
Pt woke up around 0330 on Monday morning with a headache. She then was having palpitation and chest tightness. As the day went on, palpitations stopped, but still had a heaviness and tightness in her chest. "Feels like something is wrapped around my head, I feel lightheaded". Dizziness, no nausea.

## 2020-11-09 NOTE — ED Triage Notes (Signed)
Pt arrives via [facility] from the UC in [place]. Per medic report, pt Woke up around 0330 with headache and chest tightness, persistent since then. Took ibuprofen with no relief. Having  right neck and right arm pain. Negative stroke screen gave 4 baby asa and 1 nitro 6/10 to about 5/10. Allergies to sulfa. 12 lead unremarkable. 115/73, hr 82, 18rr, 96% ra. Cbg 97.

## 2020-11-09 NOTE — ED Triage Notes (Signed)
Patient reports waking around 3:00 am with a significant headache.  Chest pain followed.  Describes chest pain as tight, squeezing, dull aching.  Patient has had heart palpitations for 2 months.

## 2020-11-10 ENCOUNTER — Emergency Department: Payer: BC Managed Care – PPO

## 2020-11-10 DIAGNOSIS — R519 Headache, unspecified: Secondary | ICD-10-CM | POA: Diagnosis not present

## 2020-11-10 LAB — RESP PANEL BY RT-PCR (FLU A&B, COVID) ARPGX2
Influenza A by PCR: NEGATIVE
Influenza B by PCR: NEGATIVE
SARS Coronavirus 2 by RT PCR: NEGATIVE

## 2020-11-10 MED ORDER — KETOROLAC TROMETHAMINE 30 MG/ML IJ SOLN
15.0000 mg | Freq: Once | INTRAMUSCULAR | Status: AC
  Administered 2020-11-10: 15 mg via INTRAVENOUS
  Filled 2020-11-10: qty 1

## 2020-11-10 MED ORDER — METOCLOPRAMIDE HCL 5 MG/ML IJ SOLN
10.0000 mg | Freq: Once | INTRAMUSCULAR | Status: AC
  Administered 2020-11-10: 10 mg via INTRAVENOUS
  Filled 2020-11-10: qty 2

## 2020-11-10 MED ORDER — LACTATED RINGERS IV BOLUS
1000.0000 mL | Freq: Once | INTRAVENOUS | Status: AC
  Administered 2020-11-10: 1000 mL via INTRAVENOUS

## 2020-11-10 MED ORDER — DIPHENHYDRAMINE HCL 50 MG/ML IJ SOLN
50.0000 mg | Freq: Once | INTRAMUSCULAR | Status: AC
  Administered 2020-11-10: 50 mg via INTRAVENOUS
  Filled 2020-11-10: qty 1

## 2020-11-10 NOTE — ED Provider Notes (Signed)
[facility] Emergency Department Provider Note  ____________________________________________  Time seen: Approximately 12:06 AM  I have reviewed the triage vital signs and the nursing notes.   HISTORY  Chief Complaint Chest Pain   HPI Megan Hawkins is a 50 y.o. female history of asthma colitis who presents for evaluation of a headache and chest pain.  Patient reports that her symptoms started yesterday at 3:30 AM.  She reports that she woke up to go to the bathroom and noticed that she had a generalized headache.  She try to go back to sleep in a couple of hours later when she woke up she noticed that the headache was persistent.  At that point she also noticed some chest pain that she describes as squeezing/tightness in the center of her chest.  Her symptoms lasted the entire day yesterday and today.  She went to urgent care and was sent to the ER via EMS for further evaluation.  Patient reports that after receiving nitro per EMS her chest pain has resolved however her headache is worse.  She reports that currently her headache is 8/10, stabbing and pressure like in nature, diffuse.  She reports history of migraine headaches although has not had one in a very long time.  She reports that usually with her migraine headaches she has photophobia which at this time she does not have it.  She does have had some intermittent palpitations, has had some nausea, mild sore throat, and 1 episode of diarrhea yesterday.  No fever or chills, no neck stiffness, no shortness of breath, no cough, no congestion, no nausea or vomiting, no abdominal pain.  Patient denies any personal or family history of subarachnoid hemorrhage or aneurysms.  She is a smoker.   Past Medical History:  Diagnosis Date   Asthma    Colitis     There are no problems to display for this patient.   Past Surgical History:  Procedure Laterality Date   BACK SURGERY     CESAREAN SECTION      Prior to  Admission medications   Medication Sig Start Date End Date Taking? Authorizing Provider  albuterol (VENTOLIN HFA) 108 (90 Base) MCG/ACT inhaler Inhale 1-2 puffs into the lungs every 6 (six) hours as needed for wheezing or shortness of breath. 09/27/20   Boddu, Erasmo Downer, FNP  DOXYCYCLINE HYCLATE PO Take by mouth.    [provider]  ibuprofen (ADVIL) 800 MG tablet Take 1 tablet (800 mg total) by mouth every 8 (eight) hours as needed for mild pain. 05/04/20   Ward, Delice Bison, DO    Allergies Sulfa antibiotics  Family History  Problem Relation Age of Onset   Heart failure Father     Social History Social History   Tobacco Use   Smoking status: Every Day    Types: Cigarettes   Smokeless tobacco: Never  Vaping Use   Vaping Use: Never used  Substance Use Topics   Alcohol use: Not Currently   Drug use: Not Currently    Review of Systems  Constitutional: Negative for fever. Eyes: Negative for visual changes. ENT: Negative for sore throat. Neck: No neck pain  Cardiovascular: Negative for chest pain. + chest tightness Respiratory: Negative for shortness of breath. Gastrointestinal: Negative for abdominal pain, vomiting or diarrhea. Genitourinary: Negative for dysuria. Musculoskeletal: Negative for back pain. Skin: Negative for rash. Neurological: Negative for  weakness or numbness. + HA Psych: No SI or HI  ____________________________________________   PHYSICAL EXAM:  VITAL  SIGNS: ED Triage Vitals  Enc Vitals Group     BP 11/09/20 2037 (!) 124/92     Pulse Rate 11/09/20 2037 83     Resp 11/09/20 2037 18     Temp 11/09/20 2037 98.5 F (36.9 C)     Temp Source 11/09/20 2037 Oral     SpO2 11/09/20 2037 96 %     Weight 11/09/20 2038 185 lb (83.9 kg)     Height 11/09/20 2038 '4\' 10"'$  (1.473 m)     Head Circumference --      Peak Flow --      Pain Score 11/09/20 2038 5     Pain Loc --      Pain Edu? --      Excl. in [place]? --     Constitutional: Alert and oriented.  Well appearing and in no apparent distress. HEENT:      Head: Normocephalic and atraumatic.         Eyes: Conjunctivae are normal. Sclera is non-icteric.       Mouth/Throat: Mucous membranes are moist.       Neck: Supple with no signs of meningismus. Cardiovascular: Regular rate and rhythm. No murmurs, gallops, or rubs. 2+ symmetrical distal pulses are present in all extremities. No JVD. Respiratory: Normal respiratory effort. Lungs are clear to auscultation bilaterally.  Gastrointestinal: Soft, non tender, and non distended with positive bowel sounds. No rebound or guarding. Musculoskeletal:  No edema, cyanosis, or erythema of extremities. Neurologic: Normal speech and language. Face is symmetric. EOMI, PERRL, normal strength and sensation x4, no pronator drift, no dysmetria, normal gait Skin: Skin is warm, dry and intact. No rash noted. Psychiatric: Mood and affect are normal. Speech and behavior are normal.  ____________________________________________   LABS (all labs ordered are listed, but only abnormal results are displayed)  Labs Reviewed  BASIC METABOLIC PANEL - Abnormal; Notable for the following components:      Result Value   Calcium 8.5 (*)    All other components within normal limits  CBC - Abnormal; Notable for the following components:   RBC 5.25 (*)    Hemoglobin 16.0 (*)    All other components within normal limits  RESP PANEL BY RT-PCR (FLU A&B, COVID) ARPGX2  TSH  T4, FREE  POC URINE PREG, ED  TROPONIN I (HIGH SENSITIVITY)  TROPONIN I (HIGH SENSITIVITY)   ____________________________________________  EKG  ED ECG REPORT I, Rudene Re, the attending physician, personally viewed and interpreted this ECG.  Normal sinus rhythm, rate of 81, normal intervals, normal axis, no ST elevations or depressions.  Normal EKG. ____________________________________________  RADIOLOGY  I have personally reviewed the images performed during this visit and I agree  with the Radiologist's read.   Interpretation by Radiologist:  DG Chest 2 View  Result Date: 11/09/2020 CLINICAL DATA:  cp EXAM: CHEST - 2 VIEW COMPARISON:  Jul 27, 2020. FINDINGS: Similar chronic prominence of the interstitial lung markings. No consolidation. No visible effusions or pneumothorax. Cardiomediastinal silhouette is within normal limits and similar to prior. Unilateral left lower thoracic pedicle screw fixation. Degenerative changes of the thoracic spine. IMPRESSION: No evidence of acute cardiopulmonary disease. Electronically Signed   By: Margaretha Sheffield M.D.   On: 11/09/2020 21:08   CT HEAD WO CONTRAST (5MM)  Result Date: 11/10/2020 CLINICAL DATA:  Headache and chest tightness. EXAM: CT HEAD WITHOUT CONTRAST TECHNIQUE: Contiguous axial images were obtained from the base of the skull through the vertex without intravenous contrast. COMPARISON:  None. FINDINGS: Brain: No evidence of acute infarction, hemorrhage, hydrocephalus, extra-axial collection or mass lesion/mass effect. Vascular: No hyperdense vessel or unexpected calcification. Skull: Normal. Negative for fracture or focal lesion. Sinuses/Orbits: No acute finding. Other: Small noncalcified scalp soft tissue nodules are seen along the anterior and posterolateral aspects of the vertex on the right. IMPRESSION: 1. No acute intracranial abnormality. 2. Small noncalcified scalp soft tissue nodules of unknown clinical significance along the anterior and posterolateral aspects of the vertex on the right. Correlation with physical exam is recommended. Electronically Signed   By: Virgina Norfolk M.D.   On: 11/10/2020 00:24     ____________________________________________   PROCEDURES  Procedure(s) performed:yes .1-3 Lead EKG Interpretation Performed by: Rudene Re, MD Authorized by: Rudene Re, MD     Interpretation: normal     ECG rate assessment: normal     Rhythm: sinus rhythm     Ectopy: none      Conduction: normal    Critical Care performed:  None ____________________________________________   INITIAL IMPRESSION / ASSESSMENT AND PLAN / ED COURSE  50 y.o. female history of asthma colitis who presents for evaluation of a generalized headache and chest tightness x 1.5 days. CP resolved after nitro. HA persistent.  She is extremely well-appearing in no distress with no signs of photophobia, no meningeal signs, no neck stiffness, neurologically intact otherwise.  She has had no fever but does report mild sore throat and diarrhea.  Ddx viral illness, dehydration, migraine headache, ICH, tension HA.   Low suspicion for more serious or life threatening etiology of HA based on history and exam. No sudden onset thunderclap HA, onset with exertion, vomiting, focal neurologic deficits, to suggest increased risk of subarachnoid hemorrhage. No fever, neck pain, neck stiffness, or meningismus on exam to suggest meningitis. No fevers, altered mental status, unusual behavior to suggest encephalitis. No focal neurologic deficits by history or exam to suggest central venous thrombosis. No constitutional symptoms including fever, fatigue, weight loss, temporal scalp tenderness, jaw claudication, visual loss, to suggest temporal arteritis. No immunocompromise to suggest increased risk for intracranial infectious disease. No visual changes or findings on ocular exam to suggest acute angle closure glaucoma. No reports of toxic exposures including carbon monoxide or other household members with similar symptoms.  EKG with no signs of ischemia.  2 high-sensitivity troponins are within normal limits.  No significant dehydration electrolyte derangements, no anemia or leukocytosis, no signs of sepsis.  Chest x-ray visualized by me with no signs of pneumonia.  Head CT visualized by me with no acute findings..  Will treat as a migraine cocktail with IV Reglan, Benadryl, fluids, and Toradol.  Old medical records reviewed  including notes from urgent care from today  _________________________ 3:08 AM on 11/10/2020 ----------------------------------------- Patient reassessed after migraine cocktail and feels markedly improved.  No longer having any chest pain or headache.  Will discharge home on supportive care follow-up with PCP.  Discussed my standard return precautions.         _____________________________________________ Please note:  Patient was evaluated in Emergency Department today for the symptoms described in the history of present illness. Patient was evaluated in the context of the global COVID-19 pandemic, which necessitated consideration that the patient might be at risk for infection with the SARS-CoV-2 virus that causes COVID-19. Institutional protocols and algorithms that pertain to the evaluation of patients at risk for COVID-19 are in a state of rapid change based on information released by regulatory bodies including the CDC and federal  and state organizations. These policies and algorithms were followed during the patient's care in the ED.  Some ED evaluations and interventions may be delayed as a result of limited staffing during the pandemic.   [place] Controlled Substance Database was reviewed by me. ____________________________________________   FINAL CLINICAL IMPRESSION(S) / ED DIAGNOSES   Final diagnoses:  Acute nonintractable headache, unspecified headache type  Chest pain, unspecified type      NEW MEDICATIONS STARTED DURING THIS VISIT:  ED Discharge Orders     None        Note:  This document was prepared using Dragon voice recognition software and may include unintentional dictation errors.    Alfred Levins, [place], MD 11/10/20 [phone]

## 2020-11-10 NOTE — Discharge Instructions (Addendum)
You have been seen in the Emergency Department (ED) for a headache. Your evaluation today was overall reassuring. Headaches have many possible causes. Most headaches aren't a sign of a more serious problem, and they will get better on their own.   Follow-up with your doctor in 12-24 hours if you are still having a headache. Otherwise follow up with your doctor in 3-5 days.  For pain take ibuprofen or tylenol  When should you call for help?  Call 911 or return to the ED anytime you think you may need emergency care. For example, call if:  You have signs of a stroke. These may include:  Sudden numbness, paralysis, or weakness in your face, arm, or leg, especially on only one side of your body.  Sudden vision changes.  Sudden trouble speaking.  Sudden confusion or trouble understanding simple statements.  Sudden problems with walking or balance.  A sudden, severe headache that is different from past headaches. You have new or worsening headache Nausea and vomiting associated with your headache Fever, neck stiffness associated with your headache  Call your doctor now or seek immediate medical care if:  You have a new or worse headache.  Your headache gets much worse.  How can you care for yourself at home?  Do not drive if you have taken a prescription pain medicine.  Rest in a quiet, dark room until your headache is gone. Close your eyes and try to relax or go to sleep. Don't watch TV or read.  Put a cold, moist cloth or cold pack on the painful area for 10 to 20 minutes at a time. Put a thin cloth between the cold pack and your skin.  Use a warm, moist towel or a heating pad set on low to relax tight shoulder and neck muscles.  Have someone gently massage your neck and shoulders.  Take pain medicines exactly as directed.  If the doctor gave you a prescription medicine for pain, take it as prescribed.  If you are not taking a prescription pain medicine, ask your doctor if you can take an  over-the-counter medicine. Be careful not to take pain medicine more often than the instructions allow, because you may get worse or more frequent headaches when the medicine wears off.  Do not ignore new symptoms that occur with a headache, such as a fever, weakness or numbness, vision changes, or confusion. These may be signs of a more serious problem.  To prevent headaches  Keep a headache diary so you can figure out what triggers your headaches. Avoiding triggers may help you prevent headaches. Record when each headache began, how long it lasted, and what the pain was like (throbbing, aching, stabbing, or dull). Write down any other symptoms you had with the headache, such as nausea, flashing lights or dark spots, or sensitivity to bright light or loud noise. Note if the headache occurred near your period. List anything that might have triggered the headache, such as certain foods (chocolate, cheese, wine) or odors, smoke, bright light, stress, or lack of sleep.  Find healthy ways to deal with stress. Headaches are most common during or right after stressful times. Take time to relax before and after you do something that has caused a headache in the past.  Try to keep your muscles relaxed by keeping good posture. Check your jaw, face, neck, and shoulder muscles for tension, and try relaxing them. When sitting at a desk, change positions often, and stretch for 30 seconds each hour.  Get plenty of sleep and exercise.  Eat regularly and well. Long periods without food can trigger a headache.  Treat yourself to a massage. Some people find that regular massages are very helpful in relieving tension.  Limit caffeine by not drinking too much coffee, tea, or soda. But don't quit caffeine suddenly, because that can also give you headaches.  Reduce eyestrain from computers by blinking frequently and looking away from the computer screen every so often. Make sure you have proper eyewear and that your monitor  is set up properly, about an arm's length away.  Seek help if you have depression or anxiety. Your headaches may be linked to these conditions. Treatment can both prevent headaches and help with symptoms of anxiety or depression.

## 2020-11-12 ENCOUNTER — Emergency Department
Admission: EM | Admit: 2020-11-12 | Discharge: 2020-11-13 | Disposition: A | Discharge status: Home / Self Care | Payer: BC Managed Care – PPO | Attending: Emergency Medicine | Admitting: Emergency Medicine

## 2020-11-12 ENCOUNTER — Encounter: Payer: Self-pay | Admitting: Emergency Medicine

## 2020-11-12 DIAGNOSIS — Z5321 Procedure and treatment not carried out due to patient leaving prior to being seen by health care provider: Secondary | ICD-10-CM | POA: Insufficient documentation

## 2020-11-12 DIAGNOSIS — R42 Dizziness and giddiness: Secondary | ICD-10-CM | POA: Diagnosis not present

## 2020-11-12 DIAGNOSIS — R519 Headache, unspecified: Secondary | ICD-10-CM | POA: Diagnosis not present

## 2020-11-12 NOTE — ED Triage Notes (Signed)
C/O headache x 1 week.  Seen through ED for same on Wednesday.  Patient also c/o dizziness.  AAOx3.  Skin warm and dry. NAD

## 2020-11-13 DIAGNOSIS — G43909 Migraine, unspecified, not intractable, without status migrainosus: Secondary | ICD-10-CM | POA: Diagnosis not present

## 2020-12-01 ENCOUNTER — Other Ambulatory Visit: Payer: Self-pay

## 2020-12-01 ENCOUNTER — Ambulatory Visit (INDEPENDENT_AMBULATORY_CARE_PROVIDER_SITE_OTHER): Payer: Self-pay | Admitting: Family Medicine

## 2020-12-01 ENCOUNTER — Encounter: Payer: Self-pay | Admitting: Family Medicine

## 2020-12-01 VITALS — BP 116/87 | HR 94 | Ht 59.0 in | Wt 184.4 lb

## 2020-12-01 DIAGNOSIS — M5030 Other cervical disc degeneration, unspecified cervical region: Secondary | ICD-10-CM

## 2020-12-01 DIAGNOSIS — G43719 Chronic migraine without aura, intractable, without status migrainosus: Secondary | ICD-10-CM | POA: Insufficient documentation

## 2020-12-01 DIAGNOSIS — G43909 Migraine, unspecified, not intractable, without status migrainosus: Secondary | ICD-10-CM | POA: Insufficient documentation

## 2020-12-01 DIAGNOSIS — M4802 Spinal stenosis, cervical region: Secondary | ICD-10-CM

## 2020-12-01 DIAGNOSIS — G43109 Migraine with aura, not intractable, without status migrainosus: Secondary | ICD-10-CM

## 2020-12-01 DIAGNOSIS — G4486 Cervicogenic headache: Secondary | ICD-10-CM

## 2020-12-01 DIAGNOSIS — M51369 Other intervertebral disc degeneration, lumbar region without mention of lumbar back pain or lower extremity pain: Secondary | ICD-10-CM

## 2020-12-01 DIAGNOSIS — M5136 Other intervertebral disc degeneration, lumbar region: Secondary | ICD-10-CM

## 2020-12-01 MED ORDER — SUMATRIPTAN SUCCINATE 100 MG PO TABS: 100.0000 mg | ORAL_TABLET | ORAL | 3 refills | Status: DC | PRN

## 2020-12-01 MED ORDER — CYCLOBENZAPRINE HCL 10 MG PO TABS
10.0000 mg | ORAL_TABLET | Freq: Three times a day (TID) | ORAL | 2 refills | Status: DC | PRN
Start: 2020-12-01 — End: 2021-05-23

## 2020-12-01 MED ORDER — NURTEC 75 MG PO TBDP: 75.0000 mg | ORAL_TABLET | Freq: Every day | ORAL | 2 refills | Status: DC | PRN

## 2020-12-01 MED ORDER — GABAPENTIN 300 MG PO CAPS: 300.0000 mg | ORAL_CAPSULE | Freq: Three times a day (TID) | ORAL | 1 refills | Status: DC

## 2020-12-01 NOTE — Patient Instructions (Addendum)
Thank you for coming to the office today.  Start Nurtec ODT dissolving tab from mail order [facility]. It can be taken once per day and prefer every other day if need for severe migraine. Hope to get it covered for further usage.  Re order Imitrex for 100mg  if need as discussed  Restart Flexeril and Gabapentin.  Stay tuned for neuro apt - consider migraine options and also eval neck may need neurosurgery they can refer if need, may need updated MRI  [facility] - Neurology Dept [place], [place] [address] Phone: [phone]   Please schedule a Follow-up Appointment to: Return in about 6 weeks (around 01/12/2021) for 6 week follow-up migraines / neuro updates.  If you have any other questions or concerns, please feel free to call the office or send a message through [place]. You may also schedule an earlier appointment if necessary.  Additionally, you may be receiving a survey about your experience at our office within a few days to 1 week by e-mail or mail. We value your feedback.  Nobie Putnam, DO [place]

## 2020-12-01 NOTE — Progress Notes (Signed)
Subjective:    Patient ID: Megan Hawkins, female    DOB: [birth year], 50 y.o.   MRN: [id number]  Megan Hawkins is a 50 y.o. female presenting on 12/01/2020 for Establish Care  Here to establish care, moved from [place].  HPI  Chronic Headaches Known history of chronic migraine headaches, but this feels different.  New problem onset few weeks now, with daily headaches. She went to [facility] ED on 11/09/20. Difficulty with generalized headache, headache would be persistent, present every day.  Describes posterior headache radiating to front, can often onset as earache and then will go more generalized, severe pain, with a stabbing pressure sharper pain.  She takes Ibuprofen 800mg  and Tylenol 400mg  approx, taking each of these every 4-6 hours alternating. Some mild relief. She missed 1 week of work due to headaches. It allows   Works as Designer, television/film set, and is on computer at work long hours, uses headset for phone.  She admits some "floaters" in her vision. But still feels different. Fairly similar. Also assoc palpitations see below. Also some nausea.  [facility] gave her Imitrex, with some relief. She was not able to take 2nd pill because she was having sedation and sleepiness.  Previously took Tramadol, Gabapentin, Flexeril for spine and chronic pain. She was on medical insurance and then had some work comp coverage.  Worker's comp with carpal tunnel and cubital tunnel (surgery 2013).  Chronic Neck Pain Last MRI, 2020 C5-C6 DDD with disc protrusion and spinal stenosis   Heart Palpitations Last see 06/2020 in ED, advised can be related to poor sleep, has had EKG and labs and other testing. Asymptomatic at this time.  No flowsheet data found.  Past Medical History:  Diagnosis Date   Allergy    Anxiety    Asthma    Colitis    Diverticulitis    Frequent headaches    Past Surgical History:  Procedure Laterality Date   BACK SURGERY     CESAREAN SECTION     Social  History   Socioeconomic History   Marital status: Significant Other    Spouse name: Not on file   Number of children: Not on file   Years of education: Not on file   Highest education level: Not on file  Occupational History   Not on file  Tobacco Use   Smoking status: Every Day    Packs/day: 0.50    Types: Cigarettes   Smokeless tobacco: Never  Vaping Use   Vaping Use: Never used  Substance and Sexual Activity   Alcohol use: Not Currently   Drug use: Never   Sexual activity: Not on file  Other Topics Concern   Not on file  Social History Narrative   Not on file   Social Determinants of Health   Financial Resource Strain: Not on file  Food Insecurity: Not on file  Transportation Needs: Not on file  Physical Activity: Not on file  Stress: Not on file  Social Connections: Not on file  Intimate Partner Violence: Not on file   Family History  Problem Relation Age of Onset   Heart failure Father    Current Outpatient Medications on File Prior to Visit  Medication Sig   albuterol (VENTOLIN HFA) 108 (90 Base) MCG/ACT inhaler Inhale 1-2 puffs into the lungs every 6 (six) hours as needed for wheezing or shortness of breath.   ibuprofen (ADVIL) 800 MG tablet Take 1 tablet (800 mg total) by mouth every 8 (eight) hours as needed  for mild pain.   No current facility-administered medications on file prior to visit.    Review of Systems Per HPI unless specifically indicated above      Objective:    BP 116/87   Pulse 94   Ht 4\' 11"  (1.499 m)   Wt 184 lb 6.4 oz (83.6 kg)   SpO2 97%   BMI 37.24 kg/m   Wt Readings from Last 3 Encounters:  12/01/20 184 lb 6.4 oz (83.6 kg)  11/12/20 184 lb 15.5 oz (83.9 kg)  11/09/20 185 lb (83.9 kg)    Physical Exam Vitals and nursing note reviewed.  Constitutional:      General: She is not in acute distress.    Appearance: She is well-developed. She is not diaphoretic.     Comments: Well-appearing, comfortable, cooperative  HENT:      Head: Normocephalic and atraumatic.  Eyes:     General:        Right eye: No discharge.        Left eye: No discharge.     Conjunctiva/sclera: Conjunctivae normal.  Neck:     Thyroid: No thyromegaly.  Cardiovascular:     Rate and Rhythm: Normal rate and regular rhythm.     Heart sounds: Normal heart sounds. No murmur heard. Pulmonary:     Effort: Pulmonary effort is normal. No respiratory distress.     Breath sounds: Normal breath sounds. No wheezing or rales.  Musculoskeletal:        General: Normal range of motion.     Cervical back: Normal range of motion and neck supple.  Lymphadenopathy:     Cervical: No cervical adenopathy.  Skin:    General: Skin is warm and dry.     Findings: No erythema or rash.  Neurological:     Mental Status: She is alert and oriented to person, place, and time.  Psychiatric:        Behavior: Behavior normal.     Comments: Well groomed, good eye contact, normal speech and thoughts      MRI CERVICAL SPINE NO CONTRAST  Anatomical Region Laterality Modality  Cervical spine -- Other  Vertebra -- --   Narrative  DOES PT HAVE?->NONE Has the patient ever had an allergic reaction to GADOLINIUM associated with an MRI?->No Has the patient received an IRON INFUSION through a vein [e.g., ferumoxytol (FERAHEME) during dialysis] within the past 3 months?->No Has the patient worked as a Probation officer or WELDER?->No Is the patient CLAUSTROPHOBIC (fear of enclosed places)?->No Is the patient wearing a TRANSDERMAL PATCH?->No Is the Patient Pregnant?->No  Procedure Note  Richardean Canal (M.D.), M.D. - 04/30/2018  4:29 PM PST  Formatting of this note might be different from the original.  CLINICAL HISTORY: Reason: Yianna Tersigni is a 50 year old        female s/p 11/2007 Laminectomy/discectomy, 10/2010 spinal fusion for    disc herniation T11-12 who reports: (since 06/2007) Pain in the neck     COMPARISON: No previous study available for comparison.                  TECHNIQUE:  Study performed per protocol.                               CONTRAST:  Findings:                                                               No prior MRIs available for comparison.                                 There is a normal cervical lordosis. Vertebral body heights are        normal. The marrow is normal in signal intensity.                       The craniocervical junction is normal in appearance. The cervical      cord is normal in signal intensity and caliber.                         At C5/C6 there is a disc osteophyte complex and bilateral              uncovertebral osteophytes causing mild to moderate spinal canal        stenosis, moderate left and mild right-sided neural foraminal          narrowing.                                                               The rest of the cervical spine demonstrates no significant spinal      canal stenosis or neural foraminal narrowing.                            IMPRESSION:                                                             Mild to moderate spinal canal stenosis at C5/C6 with bilateral neural  foraminal narrowing at this level, left greater than right as          described above.                                                         This report electronically signed by Truett Mainland, MD on 04/30/2018      4:24 PM                                                                 Results for orders placed or performed during the hospital  encounter of 11/09/20  Resp Panel by RT-PCR (Flu A&B, Covid) Nasopharyngeal Swab   Specimen: Nasopharyngeal Swab; Nasopharyngeal(NP) swabs in vial transport medium  Result Value Ref Range   SARS Coronavirus 2 by RT PCR NEGATIVE NEGATIVE   Influenza A by PCR NEGATIVE NEGATIVE   Influenza B by PCR NEGATIVE NEGATIVE  Basic metabolic panel  Result Value Ref Range   Sodium 137 135 - 145 mmol/L    Potassium 4.0 3.5 - 5.1 mmol/L   Chloride 104 98 - 111 mmol/L   CO2 24 22 - 32 mmol/L   Glucose, Bld 92 70 - 99 mg/dL   BUN 9 6 - 20 mg/dL   Creatinine, Ser 0.73 0.44 - 1.00 mg/dL   Calcium 8.5 (L) 8.9 - 10.3 mg/dL   GFR, Estimated >60 >60 mL/min   Anion gap 9 5 - 15  CBC  Result Value Ref Range   WBC 9.2 4.0 - 10.5 K/uL   RBC 5.25 (H) 3.87 - 5.11 MIL/uL   Hemoglobin 16.0 (H) 12.0 - 15.0 g/dL   HCT 45.3 36.0 - 46.0 %   MCV 86.3 80.0 - 100.0 fL   MCH 30.5 26.0 - 34.0 pg   MCHC 35.3 30.0 - 36.0 g/dL   RDW 13.2 11.5 - 15.5 %   Platelets 248 150 - 400 K/uL   nRBC 0.0 0.0 - 0.2 %  TSH  Result Value Ref Range   TSH 3.007 0.350 - 4.500 uIU/mL  T4, free  Result Value Ref Range   Free T4 0.93 0.61 - 1.12 ng/dL  Troponin I (High Sensitivity)  Result Value Ref Range   Troponin I (High Sensitivity) 3 <18 ng/L  Troponin I (High Sensitivity)  Result Value Ref Range   Troponin I (High Sensitivity) 4 <18 ng/L      Assessment & Plan:   Problem List Items Addressed This Visit     DDD (degenerative disc disease), lumbar   Relevant Medications   gabapentin (NEURONTIN) 300 MG capsule   cyclobenzaprine (FLEXERIL) 10 MG tablet   DDD (degenerative disc disease), cervical   Relevant Medications   gabapentin (NEURONTIN) 300 MG capsule   cyclobenzaprine (FLEXERIL) 10 MG tablet   Other Relevant Orders   Ambulatory referral to Neurology   Chronic migraine with aura - Primary   Relevant Medications   gabapentin (NEURONTIN) 300 MG capsule   cyclobenzaprine (FLEXERIL) 10 MG tablet   SUMAtriptan (IMITREX) 100 MG tablet   NURTEC 75 MG TBDP   Cervical spinal stenosis   Relevant Medications   gabapentin (NEURONTIN) 300 MG capsule   cyclobenzaprine (FLEXERIL) 10 MG tablet   Other Relevant Orders   Ambulatory referral to Neurology   Other Visit Diagnoses     Cervicogenic headache       Relevant Medications   gabapentin (NEURONTIN) 300 MG capsule   cyclobenzaprine (FLEXERIL) 10 MG tablet    SUMAtriptan (IMITREX) 100 MG tablet   NURTEC 75 MG TBDP   Other Relevant Orders   Ambulatory referral to Neurology       referral to Neurology for evaluation of chronic migraine headaches as well as likely cervicogenic headaches with cervical spinal DDD stenosis, also lumbar spinal stenosis. She has had recent severe worsening headaches daily for past 1+ month, has had ED visit, CT imaging, and migraine therapy initially on imitrex, now PCP ordered Nurtec. Requesting further Neuro input if migraine uncontrolled may benefit from other discussion on prophylaxis and other options if cervicogenic may warrant updated MRI, last done 2020  in outside records. May warrant neurosurgery or procedural intervention.  Restart Gabapentin titration up to appropriate dose Re order Flexeril PRN usage, as she has used this before   Re order Imitrex at higher dose 100mg  instructions given  Trial on Nurtec ODT for abortive migraine therapy, patient has >15 migraine headache days per month, persistent headaches can use PRN. Will send to [facility] mail order, and may need prior authorization. 8 pills for 30 days.   Orders Placed This Encounter  Procedures   Ambulatory referral to Neurology    Referral Priority:   Routine    Referral Type:   Consultation    Referral Reason:   Specialty Services Required    Requested Specialty:   Neurology    Number of Visits Requested:   1     Meds ordered this encounter  Medications   gabapentin (NEURONTIN) 300 MG capsule    Sig: Take 1 capsule (300 mg total) by mouth 3 (three) times daily.    Dispense:  90 capsule    Refill:  1   cyclobenzaprine (FLEXERIL) 10 MG tablet    Sig: Take 1 tablet (10 mg total) by mouth 3 (three) times daily as needed for muscle spasms.    Dispense:  90 tablet    Refill:  2   SUMAtriptan (IMITREX) 100 MG tablet    Sig: Take 1 tablet (100 mg total) by mouth every 2 (two) hours as needed for migraine. May repeat in 2 hours if headache persists  or recurs.    Dispense:  10 tablet    Refill:  3   NURTEC 75 MG TBDP    Sig: Take 75 mg by mouth daily as needed (migraine headache). Max 1 tablet in 24 hours.    Dispense:  8 tablet    Refill:  2    30 day supply, Abortive therapy for migraine headache, failed triptan previously      Follow up plan: Return in about 6 weeks (around 01/12/2021) for 6 week follow-up migraines / neuro updates.  Nobie Putnam, [facility] 12/01/2020, 3:22 PM

## 2020-12-09 ENCOUNTER — Telehealth: Payer: Self-pay

## 2020-12-09 NOTE — Telephone Encounter (Signed)
Yes these are duplicate therapy. However, she was previously on Imitrex, and this was a re order so that she had something to take immediately while waiting on the Nurtec.  They are both for "abortive / stopping" Migraines. Not for prevention.  She has been instructed to NOT take them together at the same time or on the same day.  My preference is for her to get the new Nurtec ODT to try this as her migraine stopping medication and if it is successful then she could come OFF the Imitrex.  It should be [facility] pharmacy as far as I know, that is the mail order company that we are asked to use for Nurtec Rx.  I don't believe it is the [employer].  [facility] (New Address) - [place] [address], [place] AT Previously: Malachy Mood [place]: [place] F: [place], [facility] 12/09/2020, 12:47 PM

## 2020-12-09 NOTE — Telephone Encounter (Signed)
Copied from [place] [phone]. Topic: General - Other >> Dec 09, 2020 10:52 AM Tessa Lerner A wrote: Reason for CRM: Gwinda Passe with [facility] has reached out regarding patient's prescriptions   There are concerns that patient's prescription for NURTEC 75 MG TBDP [[id number]]  and SUMAtriptan (IMITREX) 100 MG tablet [[id number]] are serving as duplicate therapies   Please contact [place] to discuss further when possible   Case ID number [id number]   The particular pharmacy used to address these concerns could not be located in Epic but it is   [facility] [address] [place], [place], [address] Phone [phone]    Fax [phone]   Dr Raliegh Ip- Can you let me know which med she is on and I will get in touch with them.

## 2021-01-06 DIAGNOSIS — R519 Headache, unspecified: Secondary | ICD-10-CM | POA: Diagnosis not present

## 2021-01-06 DIAGNOSIS — M542 Cervicalgia: Secondary | ICD-10-CM | POA: Diagnosis not present

## 2021-01-08 ENCOUNTER — Other Ambulatory Visit: Payer: Self-pay | Admitting: Family Medicine

## 2021-01-08 DIAGNOSIS — G4486 Cervicogenic headache: Secondary | ICD-10-CM

## 2021-01-08 DIAGNOSIS — M51369 Other intervertebral disc degeneration, lumbar region without mention of lumbar back pain or lower extremity pain: Secondary | ICD-10-CM

## 2021-01-08 DIAGNOSIS — M5030 Other cervical disc degeneration, unspecified cervical region: Secondary | ICD-10-CM

## 2021-01-08 DIAGNOSIS — M4802 Spinal stenosis, cervical region: Secondary | ICD-10-CM

## 2021-01-08 DIAGNOSIS — M5136 Other intervertebral disc degeneration, lumbar region: Secondary | ICD-10-CM

## 2021-01-08 NOTE — Telephone Encounter (Signed)
Requested medication (s) are due for refill today: Yes  Requested medication (s) are on the active medication list: Yes  Last refill:  12/01/20  Future visit scheduled: Yes  Notes to clinic:  Unable to refill per protocol, routing to provider for approval since just started on 12/01/20.     Requested Prescriptions  Pending Prescriptions Disp Refills   gabapentin (NEURONTIN) 300 MG capsule [Pharmacy Med Name: GABAPENTIN 300MG  CAPSULES] 90 capsule 1    Sig: TAKE 1 CAPSULE(300 MG) BY MOUTH THREE TIMES DAILY     Neurology: Anticonvulsants - gabapentin Passed - 01/08/2021  8:09 AM      Passed - Valid encounter within last 12 months    Recent Outpatient Visits           1 month ago Migraine with aura and without status migrainosus, not intractable   [place], DO       Future Appointments             In 4 days [place], Devonne Doughty, [facility], [facility]

## 2021-01-08 NOTE — Telephone Encounter (Signed)
[place] called and spoke to [place], [facility] about the refill(s) Gapaentin requested. Advised it was sent on 12/01/20 #90/1 refill(s). He says it was received, filled and picked up, there are no more refills and not due until 01/24/21. He says we could wait and send in the refill or send in now and it will be stored until 01/24/21.

## 2021-01-12 ENCOUNTER — Other Ambulatory Visit: Payer: Self-pay | Admitting: Family Medicine

## 2021-01-12 ENCOUNTER — Ambulatory Visit (INDEPENDENT_AMBULATORY_CARE_PROVIDER_SITE_OTHER): Payer: BC Managed Care – PPO | Admitting: Family Medicine

## 2021-01-12 ENCOUNTER — Telehealth: Payer: Self-pay

## 2021-01-12 ENCOUNTER — Encounter: Payer: Self-pay | Admitting: Family Medicine

## 2021-01-12 ENCOUNTER — Other Ambulatory Visit: Payer: Self-pay

## 2021-01-12 VITALS — Ht 59.0 in | Wt 188.8 lb

## 2021-01-12 DIAGNOSIS — Z23 Encounter for immunization: Secondary | ICD-10-CM | POA: Diagnosis not present

## 2021-01-12 DIAGNOSIS — Z124 Encounter for screening for malignant neoplasm of cervix: Secondary | ICD-10-CM

## 2021-01-12 DIAGNOSIS — G43109 Migraine with aura, not intractable, without status migrainosus: Secondary | ICD-10-CM

## 2021-01-12 DIAGNOSIS — M5030 Other cervical disc degeneration, unspecified cervical region: Secondary | ICD-10-CM

## 2021-01-12 DIAGNOSIS — J9801 Acute bronchospasm: Secondary | ICD-10-CM

## 2021-01-12 DIAGNOSIS — D582 Other hemoglobinopathies: Secondary | ICD-10-CM

## 2021-01-12 DIAGNOSIS — M4802 Spinal stenosis, cervical region: Secondary | ICD-10-CM | POA: Diagnosis not present

## 2021-01-12 DIAGNOSIS — Z0000 Encounter for general adult medical examination without abnormal findings: Secondary | ICD-10-CM

## 2021-01-12 DIAGNOSIS — R7309 Other abnormal glucose: Secondary | ICD-10-CM

## 2021-01-12 DIAGNOSIS — Z1231 Encounter for screening mammogram for malignant neoplasm of breast: Secondary | ICD-10-CM

## 2021-01-12 MED ORDER — ALBUTEROL SULFATE HFA 108 (90 BASE) MCG/ACT IN AERS: 1.0000 | INHALATION_SPRAY | Freq: Four times a day (QID) | RESPIRATORY_TRACT | 3 refills | Status: DC | PRN

## 2021-01-12 NOTE — Telephone Encounter (Signed)
Megan Hawkins referring for Cervical screening . Called and left voicemail for patient to call back to be scheduled.

## 2021-01-12 NOTE — Assessment & Plan Note (Signed)
Improved migraines Controlled on abortive Nurtec PRN, using 1-2 x per week approx Headache days have reduced to 1-3 per week or less - Currently without active HA Off Sumatriptan has as PRN but not taking On TCA Nortriptyline preventative  Plan: 1. Continue Nurtec ODT - will inquire if patient can get this rx from Neurology for future coverage purposes. otherwise we can order it if she notifies [place], using [facility] mail order service  Follow up with Neuro on other migraine prevention options and management.

## 2021-01-12 NOTE — Patient Instructions (Addendum)
Thank you for coming to the office today.  Follow up with Dr Melrose Nakayama as planned. If you can - ask them to  see if they can authorize and rx the Nurtec ODT going forward, it is better for insurance coverage if Neurology can order it. Otherwise if not I can help.  They can even consider Nurtec every OTHER day for prevention.  Refilled the albuterol  For Mammogram screening for breast cancer   Call the [place] below anytime to schedule your own appointment now that order has been placed.  [facility] [place],  [address] Phone: [phone]   Referral to GYN for pap smear and womens health  DUE for FASTING BLOOD WORK (no food or drink after midnight before the lab appointment, only water or coffee without cream/sugar on the morning of)  SCHEDULE "Lab Only" visit in the morning at the clinic for lab draw in 2 MONTHS   - Make sure Lab Only appointment is at about 1 week before your next appointment, so that results will be available  For Lab Results, once available within 2-3 days of blood draw, you can can log in to MyChart online to view your results and a brief explanation. Also, we can discuss results at next follow-up visit.    Please schedule a Follow-up Appointment to: Return in about 2 months (around 03/14/2021) for 2 month fasting lab only then 1 week later Annual Physical.  If you have any other questions or concerns, please feel free to call the office or send a message through [place]. You may also schedule an earlier appointment if necessary.  Additionally, you may be receiving a survey about your experience at our office within a few days to 1 week by e-mail or mail. We value your feedback.  Nobie Putnam, DO [place]

## 2021-01-12 NOTE — Progress Notes (Signed)
Subjective:    Patient ID: Megan Hawkins, female    DOB: [birth year], 50 y.o.   MRN: [id number]  Megan Hawkins is a 50 y.o. female presenting on 01/12/2021 for Migraine   HPI  Chronic Migraine - improved Cervical Spine DDD / Chronic Neck Pain, spinal stenosis.  Last visit w/ me 11/2020, started on Nurtec for abortive therapy. And referral to neuro.  [facility] doesn't cover Nurtec, she is Psychologist, clinical. Significant improvement on Nurtec ODT for abortive therapy, taking it maybe 1 x per week or less, there was 1 week without migraine headache.  Continues on Gabapentin, Flexeril PRN  Established with Dr Melrose Nakayama [facility] Neurology) evaluated for cervicogenic headaches / C-spine DDD stenosis, and also migraines. She was given rx Nortriptyline 10mg  nightly with instruction to increase to 20mg  in future.    Health Maintenance: Due for mammogram - will order to [facility]. Last done >3+ years in [place], no prior abnormal.  Due for pap smear, will refer to GYN  Flu Shot today.  Depression screen [facility] 2/9 01/12/2021 12/01/2020  Decreased Interest 1 3  Down, Depressed, Hopeless 1 1  PHQ - 2 Score 2 4  Altered sleeping 2 3  Tired, decreased energy 1 3  Change in appetite 0 3  Feeling bad or failure about yourself  0 1  Trouble concentrating 1 3  Moving slowly or fidgety/restless 0 3  Suicidal thoughts 0 0  PHQ-9 Score 6 20  Difficult doing work/chores Not difficult at all Extremely dIfficult    Social History   Tobacco Use   Smoking status: Every Day    Packs/day: 0.50    Types: Cigarettes   Smokeless tobacco: Never  Vaping Use   Vaping Use: Never used  Substance Use Topics   Alcohol use: Not Currently   Drug use: Never    Review of Systems Per HPI unless specifically indicated above     Objective:    Ht 4\' 11"  (1.499 m)   Wt 188 lb 12.8 oz (85.6 kg)   BMI 38.13 kg/m   Wt Readings from Last 3 Encounters:  01/12/21 188 lb 12.8 oz (85.6 kg)  12/01/20  184 lb 6.4 oz (83.6 kg)  11/12/20 184 lb 15.5 oz (83.9 kg)    Physical Exam Vitals and nursing note reviewed.  Constitutional:      General: She is not in acute distress.    Appearance: Normal appearance. She is well-developed. She is not diaphoretic.     Comments: Well-appearing, comfortable, cooperative  HENT:     Head: Normocephalic and atraumatic.  Eyes:     General:        Right eye: No discharge.        Left eye: No discharge.     Conjunctiva/sclera: Conjunctivae normal.  Cardiovascular:     Rate and Rhythm: Normal rate.  Pulmonary:     Effort: Pulmonary effort is normal.  Skin:    General: Skin is warm and dry.     Findings: No erythema or rash.  Neurological:     Mental Status: She is alert and oriented to person, place, and time.  Psychiatric:        Mood and Affect: Mood normal.        Behavior: Behavior normal.        Thought Content: Thought content normal.     Comments: Well groomed, good eye contact, normal speech and thoughts   Results for orders placed or performed during the hospital encounter of 11/09/20  Resp Panel by RT-PCR (Flu A&B, Covid) Nasopharyngeal Swab   Specimen: Nasopharyngeal Swab; Nasopharyngeal(NP) swabs in vial transport medium  Result Value Ref Range   SARS Coronavirus 2 by RT PCR NEGATIVE NEGATIVE   Influenza A by PCR NEGATIVE NEGATIVE   Influenza B by PCR NEGATIVE NEGATIVE  Basic metabolic panel  Result Value Ref Range   Sodium 137 135 - 145 mmol/L   Potassium 4.0 3.5 - 5.1 mmol/L   Chloride 104 98 - 111 mmol/L   CO2 24 22 - 32 mmol/L   Glucose, Bld 92 70 - 99 mg/dL   BUN 9 6 - 20 mg/dL   Creatinine, Ser 0.73 0.44 - 1.00 mg/dL   Calcium 8.5 (L) 8.9 - 10.3 mg/dL   GFR, Estimated >60 >60 mL/min   Anion gap 9 5 - 15  CBC  Result Value Ref Range   WBC 9.2 4.0 - 10.5 K/uL   RBC 5.25 (H) 3.87 - 5.11 MIL/uL   Hemoglobin 16.0 (H) 12.0 - 15.0 g/dL   HCT 45.3 36.0 - 46.0 %   MCV 86.3 80.0 - 100.0 fL   MCH 30.5 26.0 - 34.0 pg   MCHC  35.3 30.0 - 36.0 g/dL   RDW 13.2 11.5 - 15.5 %   Platelets 248 150 - 400 K/uL   nRBC 0.0 0.0 - 0.2 %  TSH  Result Value Ref Range   TSH 3.007 0.350 - 4.500 uIU/mL  T4, free  Result Value Ref Range   Free T4 0.93 0.61 - 1.12 ng/dL  Troponin I (High Sensitivity)  Result Value Ref Range   Troponin I (High Sensitivity) 3 <18 ng/L  Troponin I (High Sensitivity)  Result Value Ref Range   Troponin I (High Sensitivity) 4 <18 ng/L      Assessment & Plan:   Problem List Items Addressed This Visit     Migraine with aura and without status migrainosus, not intractable - Primary    Improved migraines Controlled on abortive Nurtec PRN, using 1-2 x per week approx Headache days have reduced to 1-3 per week or less - Currently without active HA Off Sumatriptan has as PRN but not taking On TCA Nortriptyline preventative  Plan: 1. Continue Nurtec ODT - will inquire if patient can get this rx from Neurology for future coverage purposes. otherwise we can order it if she notifies [place], using [facility] mail order service  Follow up with Neuro on other migraine prevention options and management.      Relevant Medications   nortriptyline (PAMELOR) 10 MG capsule   DDD (degenerative disc disease), cervical    Followed by [facility] Neuro On TCA Nortriptyline Refill Gabapentin, Flexeril      Cervical spinal stenosis   Other Visit Diagnoses     Needs flu shot       Relevant Orders   Flu Vaccine QUAD 57mo+IM (Fluarix, Fluzone & Alfiuria Quad PF) (Completed)   Screening for cervical cancer       Relevant Orders   Ambulatory referral to Obstetrics / Gynecology   Encounter for screening mammogram for malignant neoplasm of breast       Relevant Orders   MM 3D SCREEN BREAST BILATERAL          No orders of the defined types were placed in this encounter.   Orders Placed This Encounter  Procedures   MM 3D SCREEN BREAST BILATERAL    Standing Status:   Future    Standing Expiration Date:   01/12/2022  Order Specific Question:   Reason for Exam (SYMPTOM  OR DIAGNOSIS REQUIRED)    Answer:   Screening bilateral 3D Mammogram Tomo    Order Specific Question:   Preferred imaging location?    Answer:   [facility]   Flu Vaccine QUAD 57mo+IM (Fluarix, Fluzone & Alfiuria Quad PF)   Ambulatory referral to Obstetrics / Gynecology    Referral Priority:   Routine    Referral Type:   Consultation    Referral Reason:   Specialty Services Required    Requested Specialty:   Obstetrics and Gynecology    Number of Visits Requested:   1     Follow up plan: Return in about 2 months (around 03/14/2021) for 2 month fasting lab only then 1 week later Annual Physical.  Future labs ordered for 03/10/21   Nobie Putnam, [facility] 01/12/2021, 9:04 AM

## 2021-01-12 NOTE — Assessment & Plan Note (Signed)
Followed by [facility] Neuro On TCA Nortriptyline Refill Gabapentin, Flexeril

## 2021-01-13 NOTE — Telephone Encounter (Signed)
Called and left voicemail for patient to call back to be scheduled. 

## 2021-01-14 NOTE — Telephone Encounter (Signed)
Patient is scheduled for 02/07/21 with [facility]

## 2021-01-27 DIAGNOSIS — Z1371 Encounter for nonprocreative screening for genetic disease carrier status: Secondary | ICD-10-CM

## 2021-01-27 HISTORY — DX: Encounter for nonprocreative screening for genetic disease carrier status: Z13.71

## 2021-02-07 ENCOUNTER — Other Ambulatory Visit (HOSPITAL_COMMUNITY)
Admission: RE | Admit: 2021-02-07 | Discharge: 2021-02-07 | Disposition: A | Discharge status: Home / Self Care | Payer: BC Managed Care – PPO | Source: Ambulatory Visit | Attending: Obstetrics | Admitting: Obstetrics

## 2021-02-07 ENCOUNTER — Ambulatory Visit: Payer: BC Managed Care – PPO | Admitting: Obstetrics

## 2021-02-07 ENCOUNTER — Encounter: Payer: Self-pay | Admitting: Obstetrics

## 2021-02-07 ENCOUNTER — Other Ambulatory Visit: Payer: Self-pay

## 2021-02-07 VITALS — BP 126/74 | Ht 59.0 in | Wt 189.0 lb

## 2021-02-07 DIAGNOSIS — Z113 Encounter for screening for infections with a predominantly sexual mode of transmission: Secondary | ICD-10-CM | POA: Insufficient documentation

## 2021-02-07 DIAGNOSIS — Z8041 Family history of malignant neoplasm of ovary: Secondary | ICD-10-CM

## 2021-02-07 DIAGNOSIS — Z1231 Encounter for screening mammogram for malignant neoplasm of breast: Secondary | ICD-10-CM

## 2021-02-07 DIAGNOSIS — Z124 Encounter for screening for malignant neoplasm of cervix: Secondary | ICD-10-CM | POA: Insufficient documentation

## 2021-02-07 NOTE — Progress Notes (Signed)
Gynecology Annual Exam  PCP: Olin Hauser, DO  Chief Complaint:  Chief Complaint  Patient presents with   Annual Exam    History of Present Illness:Patient is a 50 y.o. [id number] presents for annual exam. The patient has no complaints today. She is over due for a GYN physical.  LMP: No LMP recorded. Patient has had an ablation. MThe patient is sexually active. She denies dyspareunia.  The patient does not perform self breast exams.  There is notable family history of breast or ovarian cancer in her family.  The patient wears seatbelts: yes.   The patient has regular exercise: no.    The patient denies current symptoms of depression.     Review of Systems: Review of Systems  Constitutional: Negative.   HENT: Negative.    Eyes: Negative.   Respiratory: Negative.    Cardiovascular: Negative.   Gastrointestinal: Negative.   Genitourinary: Negative.   Skin:        Several areas of past boils or skin irritation under the left breast.  Neurological: Negative.   Endo/Heme/Allergies: Negative.   Psychiatric/Behavioral: Negative.         PHQ 17 GAD13   Past Medical History:  Patient Active Problem List   Diagnosis Date Noted   Migraine with aura and without status migrainosus, not intractable 12/01/2020   DDD (degenerative disc disease), cervical 12/01/2020   Cervical spinal stenosis 12/01/2020   DDD (degenerative disc disease), lumbar 12/01/2020    Past Surgical History:  Past Surgical History:  Procedure Laterality Date   BACK SURGERY  11/2010   T11-T12, hardware placement   CESAREAN SECTION     LAMINECTOMY AND MICRODISCECTOMY THORACIC SPINE  10/2007   T11-T12   ULNAR NERVE REPAIR  2012    Gynecologic History:  No LMP recorded. Patient has had an ablation. Last Pap: Results were: nilm no abnormalities  Last mammogram: cannot remember Results were: unavailable  Obstetric History: [id number]  Family History:  Family History  Problem Relation Age of  Onset   Heart failure Father     Social History:  Social History   Socioeconomic History   Marital status: Significant Other    Spouse name: Not on file   Number of children: Not on file   Years of education: Not on file   Highest education level: Not on file  Occupational History   Not on file  Tobacco Use   Smoking status: Every Day    Packs/day: 0.50    Types: Cigarettes   Smokeless tobacco: Never  Vaping Use   Vaping Use: Never used  Substance and Sexual Activity   Alcohol use: Not Currently   Drug use: Never   Sexual activity: Yes  Other Topics Concern   Not on file  Social History Narrative   Not on file   Social Determinants of Health   Financial Resource Strain: Not on file  Food Insecurity: Not on file  Transportation Needs: Not on file  Physical Activity: Not on file  Stress: Not on file  Social Connections: Not on file  Intimate Partner Violence: Not on file    Allergies:  Allergies  Allergen Reactions   Sulfa Antibiotics Other (See Comments)    Syncope     Medications: Prior to Admission medications   Medication Sig Start Date End Date Taking? Authorizing Provider  albuterol (VENTOLIN HFA) 108 (90 Base) MCG/ACT inhaler Inhale 1-2 puffs into the lungs every 6 (six) hours as needed for wheezing or shortness  of breath. 01/12/21  Yes Karamalegos, Devonne Doughty, DO  cyclobenzaprine (FLEXERIL) 10 MG tablet Take 1 tablet (10 mg total) by mouth 3 (three) times daily as needed for muscle spasms. 12/01/20  Yes Karamalegos, Devonne Doughty, DO  gabapentin (NEURONTIN) 300 MG capsule TAKE 1 CAPSULE(300 MG) BY MOUTH THREE TIMES DAILY 01/10/21  Yes Karamalegos, Devonne Doughty, DO  ibuprofen (ADVIL) 800 MG tablet Take 1 tablet (800 mg total) by mouth every 8 (eight) hours as needed for mild pain. 05/04/20  Yes Ward, Delice Bison, DO  nortriptyline (PAMELOR) 10 MG capsule Start Nortriptyline (Pamelor) 10 mg nightly for one week, then increase to 20 mg nightly 01/06/21  Yes  [provider]  NURTEC 75 MG TBDP Take 75 mg by mouth daily as needed (migraine headache). Max 1 tablet in 24 hours. 12/01/20  Yes Karamalegos, Devonne Doughty, DO  SUMAtriptan (IMITREX) 100 MG tablet Take 1 tablet (100 mg total) by mouth every 2 (two) hours as needed for migraine. May repeat in 2 hours if headache persists or recurs. 12/01/20  Yes Olin Hauser, DO    Physical Exam Vitals: Blood pressure 126/74, height $RemoveBefore'4\' 11"'[id number]$  (1.499 m), weight 189 lb (85.7 kg).  General: NAD HEENT: normocephalic, anicteric Thyroid: no enlargement, no palpable nodules Pulmonary: No increased work of breathing, CTAB Cardiovascular: RRR, distal pulses 2+ Breast: Breast symmetrical, no tenderness, no palpable nodules or masses, no skin or nipple retraction present, no nipple discharge.  No axillary or supraclavicular lymphadenopathy. Abdomen: NABS, soft, non-tender, non-distended.  Umbilicus without lesions.  No hepatomegaly, splenomegaly or masses palpable. No evidence of hernia  Genitourinary:  External: Normal external female genitalia.  Normal urethral meatus, normal Bartholin's and Skene's glands.    Vagina: Normal vaginal mucosa, no evidence of prolapse.    Cervix: Grossly normal in appearance, no bleeding  Uterus: Non-enlarged, mobile, normal contour.  No CMT  Adnexa: ovaries non-enlarged, no adnexal masses  Rectal: deferred  Lymphatic: no evidence of inguinal lymphadenopathy Extremities: no edema, erythema, or tenderness Neurologic: Grossly intact Psychiatric: mood appropriate, affect full  Female chaperone present for pelvic and breast  portions of the physical exam     Assessment: 50 y.o. [id number] routine annual exam  Plan: Problem List Items Addressed This Visit   None   1) Mammogram - recommend yearly screening mammogram.  Mammogram Was ordered today  2) STI screening  wasoffered and accepted  3) ASCCP guidelines and rational discussed.  Patient opts for every 3 years  screening interval  4) Osteoporosis  - per USPTF routine screening DEXA at age 80   Consider FDA-approved medical therapies in postmenopausal women and men aged 53 years and older, based on the following: a) A hip or vertebral (clinical or morphometric) fracture b) T-score ? -2.5 at the femoral neck or spine after appropriate evaluation to exclude secondary causes C) Low bone mass (T-score between -1.0 and -2.5 at the femoral neck or spine) and a 10-year probability of a hip fracture ? 3% or a 10-year probability of a major osteoporosis-related fracture ? 20% based on the [place]-adapted WHO algorithm   5) Routine healthcare maintenance including cholesterol, diabetes screening discussed managed by PCP  6) Colonoscopy per her PCP.  Screening recommended starting at age 17 for average risk individuals, age 66 for individuals deemed at increased risk (including African Americans) and recommended to continue until age 32.  For patient age 58-85 individualized approach is recommended.  Gold standard screening is via colonoscopy, Cologuard screening is an acceptable alternative for  patient unwilling or unable to undergo colonoscopy.  "Colorectal cancer screening for average?risk adults: 2018 guideline update from the American Cancer Society"[place]: A Cancer Journal for Clinicians: Jul 26, 2016   7) No follow-ups on file. 8) Based on screening today, she is a candidate for Myriad testing. Two paternal aunts have had ovarian cancer. Myriad is drawn today and we will follow up with her with the testing results.   Imagene Riches, CNM  02/07/2021 9:42 AM   Jola Babinski [facility], [facility] 02/07/2021, 9:41 AM

## 2021-02-08 ENCOUNTER — Encounter: Payer: Self-pay | Admitting: Obstetrics

## 2021-02-08 DIAGNOSIS — B9689 Other specified bacterial agents as the cause of diseases classified elsewhere: Secondary | ICD-10-CM

## 2021-02-08 DIAGNOSIS — N760 Acute vaginitis: Secondary | ICD-10-CM

## 2021-02-08 LAB — CYTOLOGY - PAP
Comment: NEGATIVE
Diagnosis: NEGATIVE
High risk HPV: NEGATIVE

## 2021-02-08 LAB — CERVICOVAGINAL ANCILLARY ONLY
Bacterial Vaginitis (gardnerella): POSITIVE — AB
Chlamydia: NEGATIVE
Comment: NEGATIVE
Comment: NEGATIVE
Comment: NEGATIVE
Comment: NORMAL
Neisseria Gonorrhea: NEGATIVE
Trichomonas: NEGATIVE

## 2021-02-08 MED ORDER — METRONIDAZOLE 500 MG PO TABS: 500.0000 mg | ORAL_TABLET | Freq: Two times a day (BID) | ORAL | 0 refills | Status: AC

## 2021-02-27 DIAGNOSIS — E7800 Pure hypercholesterolemia, unspecified: Secondary | ICD-10-CM

## 2021-02-27 DIAGNOSIS — L720 Epidermal cyst: Secondary | ICD-10-CM

## 2021-02-27 HISTORY — DX: Pure hypercholesterolemia, unspecified: E78.00

## 2021-02-27 HISTORY — DX: Epidermal cyst: L72.0

## 2021-03-01 ENCOUNTER — Encounter: Payer: Self-pay | Admitting: Obstetrics and Gynecology

## 2021-03-08 DIAGNOSIS — M542 Cervicalgia: Secondary | ICD-10-CM | POA: Diagnosis not present

## 2021-03-08 DIAGNOSIS — R519 Headache, unspecified: Secondary | ICD-10-CM | POA: Diagnosis not present

## 2021-03-09 ENCOUNTER — Other Ambulatory Visit: Payer: Self-pay

## 2021-03-09 DIAGNOSIS — D582 Other hemoglobinopathies: Secondary | ICD-10-CM

## 2021-03-09 DIAGNOSIS — G43109 Migraine with aura, not intractable, without status migrainosus: Secondary | ICD-10-CM

## 2021-03-09 DIAGNOSIS — Z0000 Encounter for general adult medical examination without abnormal findings: Secondary | ICD-10-CM

## 2021-03-09 DIAGNOSIS — R7309 Other abnormal glucose: Secondary | ICD-10-CM

## 2021-03-10 ENCOUNTER — Other Ambulatory Visit: Payer: Self-pay

## 2021-03-10 ENCOUNTER — Other Ambulatory Visit: Payer: BC Managed Care – PPO

## 2021-03-10 DIAGNOSIS — G43109 Migraine with aura, not intractable, without status migrainosus: Secondary | ICD-10-CM | POA: Diagnosis not present

## 2021-03-10 DIAGNOSIS — E782 Mixed hyperlipidemia: Secondary | ICD-10-CM | POA: Diagnosis not present

## 2021-03-10 DIAGNOSIS — D582 Other hemoglobinopathies: Secondary | ICD-10-CM | POA: Diagnosis not present

## 2021-03-10 DIAGNOSIS — R7309 Other abnormal glucose: Secondary | ICD-10-CM | POA: Diagnosis not present

## 2021-03-11 LAB — COMPLETE METABOLIC PANEL WITH GFR
AG Ratio: 1.3 (calc) (ref 1.0–2.5)
ALT: 17 U/L (ref 6–29)
AST: 15 U/L (ref 10–35)
Albumin: 3.7 g/dL (ref 3.6–5.1)
Alkaline phosphatase (APISO): 76 U/L (ref 37–153)
BUN: 12 mg/dL (ref 7–25)
CO2: 28 mmol/L (ref 20–32)
Calcium: 8.8 mg/dL (ref 8.6–10.4)
Chloride: 106 mmol/L (ref 98–110)
Creat: 0.85 mg/dL (ref 0.50–1.03)
Globulin: 2.9 g/dL (calc) (ref 1.9–3.7)
Glucose, Bld: 113 mg/dL — ABNORMAL HIGH (ref 65–99)
Potassium: 4.2 mmol/L (ref 3.5–5.3)
Sodium: 139 mmol/L (ref 135–146)
Total Bilirubin: 0.4 mg/dL (ref 0.2–1.2)
Total Protein: 6.6 g/dL (ref 6.1–8.1)
eGFR: 82 mL/min/{1.73_m2} (ref >=60)

## 2021-03-11 LAB — CBC WITH DIFFERENTIAL/PLATELET
Absolute Monocytes: 666 cells/uL (ref 200–950)
Basophils Absolute: 71 cells/uL (ref 0–200)
Basophils Relative: 0.6 %
Eosinophils Absolute: 417 cells/uL (ref 15–500)
Eosinophils Relative: 3.5 %
HCT: 44.9 % (ref 35.0–45.0)
Hemoglobin: 15.3 g/dL (ref 11.7–15.5)
Lymphs Abs: 2463 cells/uL (ref 850–3900)
MCH: 30.1 pg (ref 27.0–33.0)
MCHC: 34.1 g/dL (ref 32.0–36.0)
MCV: 88.4 fL (ref 80.0–100.0)
MPV: 11.7 fL (ref 7.5–12.5)
Monocytes Relative: 5.6 %
Neutro Abs: 8282 cells/uL — ABNORMAL HIGH (ref 1500–7800)
Neutrophils Relative %: 69.6 %
Platelets: 242 10*3/uL (ref 140–400)
RBC: 5.08 10*6/uL (ref 3.80–5.10)
RDW: 12.8 % (ref 11.0–15.0)
Total Lymphocyte: 20.7 %
WBC: 11.9 10*3/uL — ABNORMAL HIGH (ref 3.8–10.8)

## 2021-03-11 LAB — LIPID PANEL
Cholesterol: 190 mg/dL (ref <200)
HDL: 41 mg/dL — ABNORMAL LOW (ref >=50)
LDL Cholesterol (Calc): 129 mg/dL (calc) — ABNORMAL HIGH
Non-HDL Cholesterol (Calc): 149 mg/dL (calc) — ABNORMAL HIGH (ref <130)
Total CHOL/HDL Ratio: 4.6 (calc) (ref <5.0)
Triglycerides: 92 mg/dL (ref <150)

## 2021-03-11 LAB — HEMOGLOBIN A1C
Hgb A1c MFr Bld: 5.8 % of total Hgb — ABNORMAL HIGH (ref <5.7)
Mean Plasma Glucose: 120 mg/dL
eAG (mmol/L): 6.6 mmol/L

## 2021-03-17 ENCOUNTER — Encounter: Payer: Self-pay | Admitting: Family Medicine

## 2021-03-17 ENCOUNTER — Other Ambulatory Visit: Payer: Self-pay

## 2021-03-17 ENCOUNTER — Ambulatory Visit (INDEPENDENT_AMBULATORY_CARE_PROVIDER_SITE_OTHER): Payer: BC Managed Care – PPO | Admitting: Family Medicine

## 2021-03-17 VITALS — BP 119/79 | HR 91 | Ht 59.0 in | Wt 191.4 lb

## 2021-03-17 DIAGNOSIS — F331 Major depressive disorder, recurrent, moderate: Secondary | ICD-10-CM | POA: Insufficient documentation

## 2021-03-17 DIAGNOSIS — R7303 Prediabetes: Secondary | ICD-10-CM

## 2021-03-17 DIAGNOSIS — M4802 Spinal stenosis, cervical region: Secondary | ICD-10-CM

## 2021-03-17 DIAGNOSIS — Z23 Encounter for immunization: Secondary | ICD-10-CM

## 2021-03-17 DIAGNOSIS — G43109 Migraine with aura, not intractable, without status migrainosus: Secondary | ICD-10-CM

## 2021-03-17 DIAGNOSIS — M5136 Other intervertebral disc degeneration, lumbar region: Secondary | ICD-10-CM

## 2021-03-17 DIAGNOSIS — Z8719 Personal history of other diseases of the digestive system: Secondary | ICD-10-CM

## 2021-03-17 DIAGNOSIS — Z1211 Encounter for screening for malignant neoplasm of colon: Secondary | ICD-10-CM

## 2021-03-17 DIAGNOSIS — Z0000 Encounter for general adult medical examination without abnormal findings: Secondary | ICD-10-CM

## 2021-03-17 DIAGNOSIS — E669 Obesity, unspecified: Secondary | ICD-10-CM

## 2021-03-17 DIAGNOSIS — M797 Fibromyalgia: Secondary | ICD-10-CM

## 2021-03-17 DIAGNOSIS — M5030 Other cervical disc degeneration, unspecified cervical region: Secondary | ICD-10-CM | POA: Diagnosis not present

## 2021-03-17 DIAGNOSIS — K5190 Ulcerative colitis, unspecified, without complications: Secondary | ICD-10-CM

## 2021-03-17 MED ORDER — DULOXETINE HCL 30 MG PO CPEP
30.0000 mg | ORAL_CAPSULE | Freq: Every day | ORAL | 3 refills | Status: DC
Start: 2021-03-17 — End: 2021-07-26

## 2021-03-17 NOTE — Progress Notes (Signed)
Subjective:    Patient ID: Megan Hawkins, female    DOB: [birth year], 51 y.o.   MRN: [id number]  Megan Hawkins is a 51 y.o. female presenting on 03/17/2021 for Annual Exam   HPI  Here for Annual Physical and Lab Review  Pre-Diabetes Daughter has T1DM She is working on improving diet, goal to limit  Chronic Migraine - improved Cervical Spine DDD / Chronic Neck Pain, spinal stenosis.   Last visit w/ me 11/2020, started on Nurtec for abortive therapy. And referral to neuro.   [facility] doesn't cover Nurtec, she is Psychologist, clinical. Significant improvement on Nurtec ODT for abortive therapy, taking it maybe 1 x per week or less, there was 1 week without migraine headache.   Continues on Gabapentin, Flexeril PRN   Established with Dr Melrose Nakayama [facility]) evaluated for cervicogenic headaches / C-spine DDD stenosis, and also migraines. She was given rx Nortriptyline 10mg  nightly with instruction to increase to 20mg  in future.   Major Depression, recurrent moderate Initial dx 2005, placed on Cymbalta, she was out of insurance and came off med since then. She was also dx Fibromyalgia     Health Maintenance: Due for mammogram - will order to [facility]. Last done >3+ years in [place], no prior abnormal. - Has breast cyst left side caused by underwire, and talking with GYN, will delay scheduling.   Ulcerative Colitis in remission  Due for Shingrix vaccine, out of stock today, can return  Due for Colonoscopy, history of Ulcerative Colitis as teenager out in [place], treated at that time has had multiple colonoscopy surveillance, and prednisone. In remission since age 40. Not had problem since, now due for screening colonoscopy, no fam history of colon cancer.  Due for Tdap     Depression screen [facility] 2/9 03/17/2021 01/12/2021 12/01/2020  Decreased Interest 1 1 3   Down, Depressed, Hopeless 1 1 1   PHQ - 2 Score 2 2 4   Altered sleeping 3 2 3   Tired, decreased energy 3 1  3   Change in appetite 3 0 3  Feeling bad or failure about yourself  0 0 1  Trouble concentrating 3 1 3   Moving slowly or fidgety/restless 0 0 3  Suicidal thoughts 0 0 0  PHQ-9 Score 14 6 20   Difficult doing work/chores Extremely dIfficult Not difficult at all Extremely dIfficult   GAD 7 : Generalized Anxiety Score 03/17/2021 01/12/2021 12/01/2020  Nervous, Anxious, on Edge 1 1 3   Control/stop worrying 0 0 3  Worry too much - different things 3 0 3  Trouble relaxing 0 0 3  Restless 1 0 3  Easily annoyed or irritable 3 1 3   Afraid - awful might happen 0 0 0  Total GAD 7 Score 8 2 18   Anxiety Difficulty Somewhat difficult Not difficult at all Extremely difficult    Past Medical History:  Diagnosis Date   Allergy    Anxiety    Asthma    BRCA negative 01/2021   MyRisk neg except AXIN2 VUS; IBIS=7.1%/riskscore=12.9%   Colitis    Diverticulitis    Family history of ovarian cancer    Frequent headaches    Past Surgical History:  Procedure Laterality Date   BACK SURGERY  11/2010   T11-T12, hardware placement   CESAREAN SECTION     LAMINECTOMY AND MICRODISCECTOMY THORACIC SPINE  10/2007   T11-T12   ULNAR NERVE REPAIR  2012   Social History   Socioeconomic History   Marital status: Significant Other  Spouse name: Not on file   Number of children: Not on file   Years of education: Not on file   Highest education level: Not on file  Occupational History   Not on file  Tobacco Use   Smoking status: Every Day    Packs/day: 0.50    Types: Cigarettes   Smokeless tobacco: Never  Vaping Use   Vaping Use: Never used  Substance and Sexual Activity   Alcohol use: Not Currently   Drug use: Never   Sexual activity: Yes  Other Topics Concern   Not on file  Social History Narrative   Not on file   Social Determinants of Health   Financial Resource Strain: Not on file  Food Insecurity: Not on file  Transportation Needs: Not on file  Physical Activity: Not on file   Stress: Not on file  Social Connections: Not on file  Intimate Partner Violence: Not on file   Family History  Problem Relation Age of Onset   Heart failure Father    Ovarian cancer Paternal Aunt    Ovarian cancer Paternal Aunt    Current Outpatient Medications on File Prior to Visit  Medication Sig   albuterol (VENTOLIN HFA) 108 (90 Base) MCG/ACT inhaler Inhale 1-2 puffs into the lungs every 6 (six) hours as needed for wheezing or shortness of breath.   cyclobenzaprine (FLEXERIL) 10 MG tablet Take 1 tablet (10 mg total) by mouth 3 (three) times daily as needed for muscle spasms.   gabapentin (NEURONTIN) 300 MG capsule TAKE 1 CAPSULE(300 MG) BY MOUTH THREE TIMES DAILY   ibuprofen (ADVIL) 800 MG tablet Take 1 tablet (800 mg total) by mouth every 8 (eight) hours as needed for mild pain.   nortriptyline (PAMELOR) 10 MG capsule Take 20 mg by mouth at bedtime.   NURTEC 75 MG TBDP Take 75 mg by mouth daily as needed (migraine headache). Max 1 tablet in 24 hours.   SUMAtriptan (IMITREX) 100 MG tablet Take 1 tablet (100 mg total) by mouth every 2 (two) hours as needed for migraine. May repeat in 2 hours if headache persists or recurs.   No current facility-administered medications on file prior to visit.    Review of Systems  Constitutional:  Negative for activity change, appetite change, chills, diaphoresis, fatigue and fever.  HENT:  Negative for congestion and hearing loss.   Eyes:  Negative for visual disturbance.  Respiratory:  Negative for cough, chest tightness, shortness of breath and wheezing.   Cardiovascular:  Negative for chest pain, palpitations and leg swelling.  Gastrointestinal:  Negative for abdominal pain, constipation, diarrhea, nausea and vomiting.  Genitourinary:  Negative for dysuria, frequency and hematuria.  Musculoskeletal:  Negative for arthralgias and neck pain.  Skin:  Negative for rash.  Neurological:  Negative for dizziness, weakness, light-headedness,  numbness and headaches.  Hematological:  Negative for adenopathy.  Psychiatric/Behavioral:  Negative for behavioral problems, dysphoric mood and sleep disturbance.   Per HPI unless specifically indicated above      Objective:    BP 119/79    Pulse 91    Ht 4\' 11"  (1.499 m)    Wt 191 lb 6.4 oz (86.8 kg)    SpO2 96%    BMI 38.66 kg/m   Wt Readings from Last 3 Encounters:  03/17/21 191 lb 6.4 oz (86.8 kg)  02/07/21 189 lb (85.7 kg)  01/12/21 188 lb 12.8 oz (85.6 kg)    Physical Exam Vitals and nursing note reviewed.  Constitutional:  General: She is not in acute distress.    Appearance: She is well-developed. She is not diaphoretic.     Comments: Well-appearing, comfortable, cooperative  HENT:     Head: Normocephalic and atraumatic.  Eyes:     General:        Right eye: No discharge.        Left eye: No discharge.     Conjunctiva/sclera: Conjunctivae normal.     Pupils: Pupils are equal, round, and reactive to light.  Neck:     Thyroid: No thyromegaly.  Cardiovascular:     Rate and Rhythm: Normal rate and regular rhythm.     Pulses: Normal pulses.     Heart sounds: Normal heart sounds. No murmur heard. Pulmonary:     Effort: Pulmonary effort is normal. No respiratory distress.     Breath sounds: Normal breath sounds. No wheezing or rales.  Abdominal:     General: Bowel sounds are normal. There is no distension.     Palpations: Abdomen is soft. There is no mass.     Tenderness: There is no abdominal tenderness.  Musculoskeletal:        General: No tenderness. Normal range of motion.     Cervical back: Normal range of motion and neck supple.     Comments: Upper / Lower Extremities: - Normal muscle tone, strength bilateral upper extremities 5/5, lower extremities 5/5  Lymphadenopathy:     Cervical: No cervical adenopathy.  Skin:    General: Skin is warm and dry.     Findings: No erythema or rash.  Neurological:     Mental Status: She is alert and oriented to  person, place, and time.     Comments: Distal sensation intact to light touch all extremities  Psychiatric:        Mood and Affect: Mood normal.        Behavior: Behavior normal.        Thought Content: Thought content normal.     Comments: Well groomed, good eye contact, normal speech and thoughts     Results for orders placed or performed in visit on 03/09/21  Hemoglobin A1c  Result Value Ref Range   Hgb A1c MFr Bld 5.8 (H) <5.7 % of total Hgb   Mean Plasma Glucose 120 mg/dL   eAG (mmol/L) 6.6 mmol/L  CBC with Differential/Platelet  Result Value Ref Range   WBC 11.9 (H) 3.8 - 10.8 Thousand/uL   RBC 5.08 3.80 - 5.10 Million/uL   Hemoglobin 15.3 11.7 - 15.5 g/dL   HCT 44.9 35.0 - 45.0 %   MCV 88.4 80.0 - 100.0 fL   MCH 30.1 27.0 - 33.0 pg   MCHC 34.1 32.0 - 36.0 g/dL   RDW 12.8 11.0 - 15.0 %   Platelets 242 140 - 400 Thousand/uL   MPV 11.7 7.5 - 12.5 fL   Neutro Abs 8,282 (H) 1,500 - 7,800 cells/uL   Lymphs Abs 2,463 850 - 3,900 cells/uL   Absolute Monocytes 666 200 - 950 cells/uL   Eosinophils Absolute 417 15 - 500 cells/uL   Basophils Absolute 71 0 - 200 cells/uL   Neutrophils Relative % 69.6 %   Total Lymphocyte 20.7 %   Monocytes Relative 5.6 %   Eosinophils Relative 3.5 %   Basophils Relative 0.6 %  Lipid panel  Result Value Ref Range   Cholesterol 190 <200 mg/dL   HDL 41 (L) > OR = 50 mg/dL   Triglycerides 92 <150 mg/dL   LDL  Cholesterol (Calc) 129 (H) mg/dL (calc)   Total CHOL/HDL Ratio 4.6 <5.0 (calc)   Non-HDL Cholesterol (Calc) 149 (H) <130 mg/dL (calc)  COMPLETE METABOLIC PANEL WITH GFR  Result Value Ref Range   Glucose, Bld 113 (H) 65 - 99 mg/dL   BUN 12 7 - 25 mg/dL   Creat 0.85 0.50 - 1.03 mg/dL   eGFR 82 > OR = 60 mL/min/1.18m2   BUN/Creatinine Ratio NOT APPLICABLE 6 - 22 (calc)   Sodium 139 135 - 146 mmol/L   Potassium 4.2 3.5 - 5.3 mmol/L   Chloride 106 98 - 110 mmol/L   CO2 28 20 - 32 mmol/L   Calcium 8.8 8.6 - 10.4 mg/dL   Total Protein 6.6  6.1 - 8.1 g/dL   Albumin 3.7 3.6 - 5.1 g/dL   Globulin 2.9 1.9 - 3.7 g/dL (calc)   AG Ratio 1.3 1.0 - 2.5 (calc)   Total Bilirubin 0.4 0.2 - 1.2 mg/dL   Alkaline phosphatase (APISO) 76 37 - 153 U/L   AST 15 10 - 35 U/L   ALT 17 6 - 29 U/L      Assessment & Plan:   Problem List Items Addressed This Visit     Pre-diabetes   Obesity (BMI 35.0-39.9 without comorbidity)   Migraine with aura and without status migrainosus, not intractable   Relevant Medications   DULoxetine (CYMBALTA) 30 MG capsule   Major depressive disorder, recurrent, moderate (HCC)   Relevant Medications   DULoxetine (CYMBALTA) 30 MG capsule   Fibromyalgia   Relevant Medications   DULoxetine (CYMBALTA) 30 MG capsule   DDD (degenerative disc disease), lumbar   DDD (degenerative disc disease), cervical   Cervical spinal stenosis   Other Visit Diagnoses     Annual physical exam    -  Primary   Need for diphtheria-tetanus-pertussis (Tdap) vaccine       Relevant Orders   Tdap vaccine greater than or equal to 7yo IM   Need for shingles vaccine       Relevant Orders   Varicella-zoster vaccine IM   History of ulcerative colitis       Relevant Orders   Ambulatory referral to Gastroenterology   Ulcerative colitis in remission [facility])       Relevant Orders   Ambulatory referral to Gastroenterology   Screening for colon cancer       Relevant Orders   Ambulatory referral to Gastroenterology       Updated Health Maintenance information TDap today Return for Shingrix Reviewed recent lab results with patient Encouraged improvement to lifestyle with diet and exercise Goal of weight loss  PreDM A1c Stable A1c 5.8 Encourage lifestyle diet modification  Obesity We can discuss at future visit rx management for wt loss, and PreDM she is interested in GLP1  Hx UC in remission Will refer to GI / Colonoscopy  Anxiety Depression recurrent in remission Fibromyalgia Will order restart SNRI Duloxetine $RemoveBeforeD'30mg'[id number]$  daily,  for anxiety/mood/fibro/migraine, caution serotonin syndrome w/ on Nortriptyline discussed dosing and advice precautions  Migraines Improved on preventative therapy Followed by Neuro  Orders Placed This Encounter  Procedures   Varicella-zoster vaccine IM   Tdap vaccine greater than or equal to 7yo IM   Ambulatory referral to Gastroenterology    Referral Priority:   Routine    Referral Type:   Consultation    Referral Reason:   Specialty Services Required    Number of Visits Requested:   1     Meds ordered this  encounter  Medications   DULoxetine (CYMBALTA) 30 MG capsule    Sig: Take 1 capsule (30 mg total) by mouth daily.    Dispense:  30 capsule    Refill:  3      Follow up plan: Return in about 3 months (around 06/15/2021) for 3 month follow-up Mood med / Migraines / Shingrix / Weight meds.  Nobie Putnam, [facility] 03/17/2021, 9:19 AM

## 2021-03-17 NOTE — Patient Instructions (Addendum)
Thank you for coming to the office today.  Start back on Duloxetine 30mg  daily, keep in mind this can help pain and mood, and it is similar to the NOrtriptyline   I would avoid dose increases on both if possible.  Caution with Serotonin Syndrome if too high of a dose  Serotonin syndrome encompasses a spectrum of disease where the intensity of clinical findings is thought to reflect the degree of serotonergic activity [10]. Mental status changes can include anxiety, restlessness, disorientation, and agitated delirium [14]. Patients may startle easily. Autonomic manifestations can include diaphoresis, tachycardia, hyperthermia, hypertension, vomiting, and diarrhea   [facility] [address], [place] [address] Phone: [phone]  TDap today  Return to Shingrix 1st vaccine when ready. Or next time  Check into the weight loss meds.  Ozempic, Trulicity  Wt loss only - [place], [place]  Please schedule a Follow-up Appointment to: Return in about 3 months (around 06/15/2021) for 3 month follow-up Mood med / Migraines / Shingrix / Weight meds.  If you have any other questions or concerns, please feel free to call the office or send a message through [place]. You may also schedule an earlier appointment if necessary.  Additionally, you may be receiving a survey about your experience at our office within a few days to 1 week by e-mail or mail. We value your feedback.  Nobie Putnam, DO [place]

## 2021-03-22 ENCOUNTER — Telehealth: Payer: Self-pay

## 2021-03-22 NOTE — Telephone Encounter (Signed)
CALLED PATIENT NO ANSWER LEFT VOICEMAIL FOR A CALL BACK LETTER SENT 

## 2021-05-21 ENCOUNTER — Other Ambulatory Visit: Payer: Self-pay | Admitting: Family Medicine

## 2021-05-21 DIAGNOSIS — G4486 Cervicogenic headache: Secondary | ICD-10-CM

## 2021-05-21 DIAGNOSIS — M5136 Other intervertebral disc degeneration, lumbar region: Secondary | ICD-10-CM

## 2021-05-21 DIAGNOSIS — M4802 Spinal stenosis, cervical region: Secondary | ICD-10-CM

## 2021-05-21 DIAGNOSIS — M5030 Other cervical disc degeneration, unspecified cervical region: Secondary | ICD-10-CM

## 2021-05-23 NOTE — Telephone Encounter (Signed)
Requested medications are due for refill today.  yes ? ?Requested medications are on the active medications list.  yes ? ?Last refill. 12/01/2020 #90 2 refills ? ?Future visit scheduled.   yes ? ?Notes to clinic.  Medication refill is not delegated. ? ? ? ?Requested Prescriptions  ?Pending Prescriptions Disp Refills  ? cyclobenzaprine (FLEXERIL) 10 MG tablet [Pharmacy Med Name: CYCLOBENZAPRINE '10MG'$  TABLETS] 90 tablet 2  ?  Sig: TAKE 1 TABLET(10 MG) BY MOUTH THREE TIMES DAILY AS NEEDED FOR MUSCLE SPASMS  ?  ? Not Delegated - Analgesics:  Muscle Relaxants Failed - 05/21/2021  1:22 PM  ?  ?  Failed - This refill cannot be delegated  ?  ?  Passed - Valid encounter within last 6 months  ?  Recent Outpatient Visits   ? ?      ? 2 months ago Annual physical exam  ? [place], DO  ? 4 months ago Migraine with aura and without status migrainosus, not intractable  ? [place], DO  ? 5 months ago Migraine with aura and without status migrainosus, not intractable  ? [place], DO  ? ?  ?  ?Future Appointments   ? ?        ? In 1 month Karamalegos, Devonne Doughty, DO [facility] [place]  ? ?  ? ?  ?  ?  ?Signed Prescriptions Disp Refills  ? gabapentin (NEURONTIN) 300 MG capsule 90 capsule 1  ?  Sig: TAKE 1 CAPSULE(300 MG) BY MOUTH THREE TIMES DAILY  ?  ? Neurology: Anticonvulsants - gabapentin Passed - 05/21/2021  1:22 PM  ?  ?  Passed - Cr in normal range and within 360 days  ?  Creat  ?Date Value Ref Range Status  ?03/10/2021 0.85 0.50 - 1.03 mg/dL Final  ?  ?  ?  ?  Passed - Completed PHQ-2 or PHQ-9 in the last 360 days  ?  ?  Passed - Valid encounter within last 12 months  ?  Recent Outpatient Visits   ? ?      ? 2 months ago Annual physical exam  ? [place], DO  ? 4 months ago Migraine with aura and without status migrainosus, not intractable  ?  [place], DO  ? 5 months ago Migraine with aura and without status migrainosus, not intractable  ? [place], DO  ? ?  ?  ?Future Appointments   ? ?        ? In 1 month Karamalegos, Devonne Doughty, DO [facility] [place]  ? ?  ? ?  ?  ?  ?  ?

## 2021-05-23 NOTE — Telephone Encounter (Signed)
Requested Prescriptions  ?Pending Prescriptions Disp Refills  ?? gabapentin (NEURONTIN) 300 MG capsule [Pharmacy Med Name: GABAPENTIN '300MG'$  CAPSULES] 90 capsule 1  ?  Sig: TAKE 1 CAPSULE(300 MG) BY MOUTH THREE TIMES DAILY  ?  ? Neurology: Anticonvulsants - gabapentin Passed - 05/21/2021  1:22 PM  ?  ?  Passed - Cr in normal range and within 360 days  ?  Creat  ?Date Value Ref Range Status  ?03/10/2021 0.85 0.50 - 1.03 mg/dL Final  ?   ?  ?  Passed - Completed PHQ-2 or PHQ-9 in the last 360 days  ?  ?  Passed - Valid encounter within last 12 months  ?  Recent Outpatient Visits   ?      ? 2 months ago Annual physical exam  ? [place], DO  ? 4 months ago Migraine with aura and without status migrainosus, not intractable  ? [place], DO  ? 5 months ago Migraine with aura and without status migrainosus, not intractable  ? [place], DO  ?  ?  ?Future Appointments   ?        ? In 1 month Karamalegos, [facility]  ?  ? ?  ?  ?  ?? cyclobenzaprine (FLEXERIL) 10 MG tablet [Pharmacy Med Name: CYCLOBENZAPRINE '10MG'$  TABLETS] 90 tablet 2  ?  Sig: TAKE 1 TABLET(10 MG) BY MOUTH THREE TIMES DAILY AS NEEDED FOR MUSCLE SPASMS  ?  ? Not Delegated - Analgesics:  Muscle Relaxants Failed - 05/21/2021  1:22 PM  ?  ?  Failed - This refill cannot be delegated  ?  ?  Passed - Valid encounter within last 6 months  ?  Recent Outpatient Visits   ?      ? 2 months ago Annual physical exam  ? [place], DO  ? 4 months ago Migraine with aura and without status migrainosus, not intractable  ? [place], DO  ? 5 months ago Migraine with aura and without status migrainosus, not intractable  ? [place], DO  ?  ?  ?Future Appointments   ?        ? In 1 month  Karamalegos, Devonne Doughty, DO [facility] [place]  ?  ? ?  ?  ?  ? ?

## 2021-06-22 ENCOUNTER — Other Ambulatory Visit: Payer: Self-pay

## 2021-06-22 ENCOUNTER — Encounter: Payer: Self-pay | Admitting: Family Medicine

## 2021-06-22 ENCOUNTER — Ambulatory Visit (INDEPENDENT_AMBULATORY_CARE_PROVIDER_SITE_OTHER): Payer: BC Managed Care – PPO | Admitting: Family Medicine

## 2021-06-22 ENCOUNTER — Telehealth: Payer: Self-pay

## 2021-06-22 VITALS — BP 126/78 | HR 91 | Ht 59.0 in | Wt 197.8 lb

## 2021-06-22 DIAGNOSIS — R7303 Prediabetes: Secondary | ICD-10-CM | POA: Diagnosis not present

## 2021-06-22 DIAGNOSIS — Z1211 Encounter for screening for malignant neoplasm of colon: Secondary | ICD-10-CM

## 2021-06-22 DIAGNOSIS — E669 Obesity, unspecified: Secondary | ICD-10-CM

## 2021-06-22 DIAGNOSIS — Z23 Encounter for immunization: Secondary | ICD-10-CM

## 2021-06-22 DIAGNOSIS — F331 Major depressive disorder, recurrent, moderate: Secondary | ICD-10-CM

## 2021-06-22 DIAGNOSIS — D170 Benign lipomatous neoplasm of skin and subcutaneous tissue of head, face and neck: Secondary | ICD-10-CM

## 2021-06-22 DIAGNOSIS — L720 Epidermal cyst: Secondary | ICD-10-CM

## 2021-06-22 MED ORDER — NA SULFATE-K SULFATE-MG SULF 17.5-3.13-1.6 GM/177ML PO SOLN: 1.0000 | Freq: Once | ORAL | 0 refills | Status: AC

## 2021-06-22 NOTE — Patient Instructions (Addendum)
Thank you for coming to the office today. ? ?Shingrix vaccine #1 today, return 2-6 months for repeat 2nd final dose. ? ?Continue Duloxetine '30mg'$  daily. ? ?Call insurance find cost and coverage of the following - check the following: ?- Drug Tier, Preferred List, On Formulary ?- All will require a "Prior Authorization" from [place] first, before you can find out the cost ?- Find out if there is "Step Therapy" (other medicines required before you can try these) ? ?Obesity BMI >39 dx code ICD:10 = E66.9 ? ?Once you pick the one you want to try, let me know - we can get a sample ready IF we have it in stock. Then try it - and before running out of medicine, contact me back to order your Rx so we have time to get it processed. ? ?Keep up good work with exercise plan on elliptical ? ? ?For Weight Loss / Obesity only ? ?Wegovy (same as Ozempic) weekly injection - start 0.'25mg'$  weekly, 1 dose per pen, single use, auto-injector ? ?2. Saxenda - DAILY injection - start 0.'6mg'$  injection DAILY, sample is 3 weeks, new needle each dose. ? ?OTHER medicines that are possibilities include ? ?Contrave (wellbutrin + naltrexone combo) weight loss appetite suppression pill, brand name, would use specialty mail order pharmacy approx cost is about $100. ? ?Lipoma or Cyst of scalp ? ? [facility] ?[address] ?[address] ?[place],  [place]  [address] ?Phone: [phone] ? ?--------------------------------------- ? ?Direct # to Ginger at [facility] can call her directly at [phone] ? ? ? ?Please schedule a Follow-up Appointment to: Return in about 3 months (around 09/21/2021) for after start medicine 3 month follow-up Weight Management . ? ?If you have any other questions or concerns, please feel free to call the office or send a message through [place]. You may also schedule an earlier appointment if necessary. ? ?Additionally, you may be receiving a survey about your experience at our office within a few days to 1 week  by e-mail or mail. We value your feedback. ? ?Nobie Putnam, DO ?[place] ? ? ?

## 2021-06-22 NOTE — Progress Notes (Signed)
? ?Subjective:  ? ? Patient ID: Megan Hawkins, female    DOB: 11/28/1970, 51 y.o.   MRN: [id number] ? ?Megan Hawkins is a 51 y.o. female presenting on 06/22/2021 for Depression and Obesity ? ? ?HPI ? ?Obesity BMI >39 ?PreDM ?Interested in GLP1 therapy weight loss management ?She has improved diet and lifestyle best she can to help with weight loss, interested in med now ?  ?Chronic Migraine - improved ?Cervical Spine DDD / Chronic Neck Pain, spinal stenosis. ?  ?Established with Dr Melrose Nakayama [facility]) evaluated for cervicogenic headaches / C-spine DDD stenosis, and also migraines.  ?On Nortriptyline 62m nightly ?  ?Major Depression, recurrent moderate ?Initial dx 2005, placed on Cymbalta, she was out of insurance and came off med since then. ?She was also dx Fibromyalgia ? ?Interval update, last visit with me 02/2021, she was restarted on SNRI Duloxetine 380mdaily for both mood, anxiety and pain with fibro ?improved mood/anxiety scores ?She is doing very well now on current therapy, continues on Duloxetine 3044maily, also continues on Nortriptyline as well per Neuro. ?  ?  ?Health Maintenance: ?[facility] [place]. ?- Has breast cyst left side caused by underwire, and talking with GYN, will delay scheduling. ?  ?Ulcerative Colitis in remission ?  ?Due for Shingrix vaccine, #1 dose today ?  ?Due for Colonoscopy, history [place] had multiple colonoscopy surveillance, and prednisone. In remission since age [age]ot had problem since, now due for screening colonoscopy, no fam history of colon cancer. ? ?\Unable to schedule due to unable to connect w/ GI office ? ? ?  06/22/2021  ?  1:10 PM 03/17/2021  ?  9:01 AM 01/12/2021  ?  8:51 AM  ?Depression screen PHQ 2/9  ?Decreased Interest _0 ?Down, Depressed, Hopeless 0 1 1  ?PHQ - 2 Score _1 ?Altered sleeping _2 ?Tired, decreased energy _3 ?Change in appetite 1 3 0  ?Feeling bad or failure about yourself  0 0 0  ?Trouble concentrating _4 ?Moving slowly or fidgety/restless 0 0 0  ?Suicidal thoughts 0 0 0  ?PHQ-9 Score _5 ?Difficult doing work/chores Somewhat difficult Extremely dIfficult Not difficult at all  ? ? ?  03/17/2021  ?  9:01 AM 01/12/2021  ?  8:51 AM 12/01/2020  ?  3:54 PM  ?GAD 7 : Generalized Anxiety Score  ?Nervous, Anxious, on Edge _6 ?Control/stop worrying 0 0 3  ?Worry too much - different things 3 0 3  ?Trouble relaxing 0 0 3  ?Restless 1 0 3  ?Easily annoyed or irritable _7 ?Afraid - awful might happen 0 0 0  ?Total GAD 7 Score _8 ?Anxiety Difficulty Somewhat difficult Not difficult at all Extremely difficult  ? ? ? ? ?Social History  ? ?Tobacco Use  ? Smoking status: Every Day  ?  Packs/day: 0.50  ?  Types: Cigarettes  ? Smokeless tobacco: Never  ?Vaping Use  ? Vaping Use: Never used  ?Substance Use Topics  ? Alcohol use: Not Currently  ? Drug use: Never  ? ? ?Review of Systems ?Per HPI unless specifically indicated above ? ?   ?Objective:  ?  ?BP 126/78   Pulse 91   Ht  4' 11" (1.499 m)   Wt 197 lb 12.8 oz (89.7 kg)   SpO2 98%   BMI 39.95 kg/m?   ?Wt Readings from Last 3 Encounters:  ?06/22/21 197 lb 12.8 oz (89.7 kg)  ?03/17/21 191 lb 6.4 oz (86.8 kg)  ?02/07/21 189 lb (85.7 kg)  ?  ?Physical Exam ?Vitals and nursing note reviewed.  ?Constitutional:   ?   General: She is not in acute distress. ?   Appearance: Normal appearance. She is well-developed. She is not diaphoretic.  ?   Comments: Well-appearing, comfortable, cooperative  ?HENT:  ?   Head: Normocephalic and atraumatic.  ?Eyes:  ?   General:     ?   Right eye: No discharge.     ?   Left eye: No discharge.  ?   Conjunctiva/sclera: Conjunctivae normal.  ?Cardiovascular:  ?   Rate and Rhythm: Normal rate.  ?Pulmonary:  ?   Effort: Pulmonary effort is normal.  ?Skin: ?   General: Skin is warm and dry.  ?   Findings:  Lesion (central scalp with 2 x 2 cm soft raised nodule likely lipoma vs cyst, and smaller < 1 cm lesion back of scalp) present. No erythema or rash.  ?Neurological:  ?   Mental Status: She is alert and oriented to person, place, and time.  ?Psychiatric:     ?   Mood and Affect: Mood normal.     ?   Behavior: Behavior normal.     ?   Thought Content: Thought content normal.  ?   Comments: Well groomed, good eye contact, normal speech and thoughts  ? ? ? ?Results for orders placed or performed in visit on 03/09/21  ?Hemoglobin A1c  ?Result Value Ref Range  ? Hgb A1c MFr Bld 5.8 (H) <5.7 % of total Hgb  ? Mean Plasma Glucose 120 mg/dL  ? eAG (mmol/L) 6.6 mmol/L  ?CBC with Differential/Platelet  ?Result Value Ref Range  ? WBC 11.9 (H) 3.8 - 10.8 Thousand/uL  ? RBC 5.08 3.80 - 5.10 Million/uL  ? Hemoglobin 15.3 11.7 - 15.5 g/dL  ? HCT 44.9 35.0 - 45.0 %  ? MCV 88.4 80.0 - 100.0 fL  ? MCH 30.1 27.0 - 33.0 pg  ? MCHC 34.1 32.0 - 36.0 g/dL  ? RDW 12.8 11.0 - 15.0 %  ? Platelets 242 140 - 400 Thousand/uL  ? MPV 11.7 7.5 - 12.5 fL  ? Neutro Abs 8,282 (H) 1,500 - 7,800 cells/uL  ? Lymphs Abs 2,463 850 - 3,900 cells/uL  ? Absolute Monocytes 666 200 - 950 cells/uL  ? Eosinophils Absolute 417 15 - 500 cells/uL  ? Basophils Absolute 71 0 - 200 cells/uL  ? Neutrophils Relative % 69.6 %  ? Total Lymphocyte 20.7 %  ? Monocytes Relative 5.6 %  ? Eosinophils Relative 3.5 %  ? Basophils Relative 0.6 %  ?Lipid panel  ?Result Value Ref Range  ? Cholesterol 190 <200 mg/dL  ? HDL 41 (L) > OR = 50 mg/dL  ? Triglycerides 92 <150 mg/dL  ? LDL Cholesterol (Calc) 129 (H) mg/dL (calc)  ? Total CHOL/HDL Ratio 4.6 <5.0 (calc)  ? Non-HDL Cholesterol (Calc) 149 (H) <130 mg/dL (calc)  ?COMPLETE METABOLIC PANEL WITH GFR  ?Result Value Ref Range  ? Glucose, Bld 113 (H) 65 - 99 mg/dL  ? BUN 12 7 - 25 mg/dL  ? Creat 0.85 0.50 - 1.03 mg/dL  ? eGFR 82 > OR = 60 mL/min/1.48m  ? BUN/Creatinine  Ratio NOT APPLICABLE 6 - 22 (calc)  ? Sodium 139 135 - 146 mmol/L   ? Potassium 4.2 3.5 - 5.3 mmol/L  ? Chloride 106 98 - 110 mmol/L  ? CO2 28 20 - 32 mmol/L  ? Calcium 8.8 8.6 - 10.4 mg/dL  ? Total Protein 6.6 6.1 - 8.1 g/dL  ? Albumin 3.7 3.6 - 5.1 g/dL  ? Globulin 2.9 1.9 - 3.7 g/dL (calc)  ? AG Ratio 1.3 1.0 - 2.5 (calc)  ? Total Bilirubin 0.4 0.2 - 1.2 mg/dL  ? Alkaline phosphatase (APISO) 76 37 - 153 U/L  ? AST 15 10 - 35 U/L  ? ALT 17 6 - 29 U/L  ? ?   ?Assessment & Plan:  ? ?Problem List Items Addressed This Visit   ? ? Pre-diabetes  ? Obesity (BMI 35.0-39.9 without comorbidity)  ? Major depressive disorder, recurrent, moderate ([place]) - Primary  ? ?Other Visit Diagnoses   ? ? Need for shingles vaccine      ? Relevant Orders  ? Varicella-zoster vaccine IM (Completed)  ? Lipoma of scalp      ? Relevant Orders  ? Ambulatory referral to General Surgery  ? Epidermoid cyst of skin of scalp      ? Relevant Orders  ? Ambulatory referral to General Surgery  ? ?  ?  ?Shingrix vaccine #1 today, return 2-6 months for repeat 2nd final dose. ? ?Major Depression, recurrent ?Improved ?Continue Duloxetine 18m daily. ? ?Obesity BMI >39 ?Discuss GLP1 therapy for wt management, AVS handout with information on options and recommendations, she can [place] one to trial sample then order when ready will need PA ? ?Lipoma or Cyst of scalp ? ?[facility] ?[address] ?[place]  [address]?Phone: [phone]? ?--------------------------------------- ? ?RE Colonoscopy scheduling - [facility] [place] [facility] [place] [facility] [place] [facility] for contact. It is now scheduled. ? ? ?Orders Placed This Encounter  ?Procedures  ? Varicella-zoster vaccine IM  ? Ambulatory referral to General Surgery  ?  Referral Priority:   Routine  ?  Referral Type:   Surgical  ?  Referral Reason:   Specialty Services Required  ?  Requested Specialty:   General Surgery  ?  Number of Visits Requested:   1  ? ? ? ?No orders of the defined types were placed in this  encounter. ? ? ? ? ?Follow up plan: ?Return in about 3 months (around 09/21/2021) for after start medicine 3 month follow-up Weight Management . ? ?ANobie Putnam DO ?[facility] ?

## 2021-06-22 NOTE — Telephone Encounter (Signed)
gastroenterology Pre-Procedure Review ? ?Request Date:  ?Requesting Physician:  ? ?PATIENT REVIEW QUESTIONS: The patient responded to the following health history questions as indicated:   ? ?1. Are you having any GI issues? no ?2. Do you have a personal history of Polyps? no ?3. Do you have a family history of Colon Cancer or Polyps? no ?4. Diabetes Mellitus? no ?5. Joint replacements in the past 12 months?no ?6. Major health problems in the past 3 months?no ?7. Any artificial heart valves, MVP, or defibrillator?no ?   ?MEDICATIONS & ALLERGIES:    ?Patient reports the following regarding taking any anticoagulation/antiplatelet therapy:   ?Plavix, Coumadin, Eliquis, Xarelto, Lovenox, Pradaxa, Brilinta, or Effient? no ?Aspirin? no ? ?Patient confirms/reports the following medications:  ?Current Outpatient Medications  ?Medication Sig Dispense Refill  ? albuterol (VENTOLIN HFA) 108 (90 Base) MCG/ACT inhaler Inhale 1-2 puffs into the lungs every 6 (six) hours as needed for wheezing or shortness of breath. 8 g 3  ? cyclobenzaprine (FLEXERIL) 10 MG tablet TAKE 1 TABLET(10 MG) BY MOUTH THREE TIMES DAILY AS NEEDED FOR MUSCLE SPASMS 90 tablet 2  ? DULoxetine (CYMBALTA) 30 MG capsule Take 1 capsule (30 mg total) by mouth daily. 30 capsule 3  ? gabapentin (NEURONTIN) 300 MG capsule TAKE 1 CAPSULE(300 MG) BY MOUTH THREE TIMES DAILY 90 capsule 1  ? ibuprofen (ADVIL) 800 MG tablet Take 1 tablet (800 mg total) by mouth every 8 (eight) hours as needed for mild pain. 30 tablet 0  ? nortriptyline (PAMELOR) 10 MG capsule Take 20 mg by mouth at bedtime.    ? NURTEC 75 MG TBDP Take 75 mg by mouth daily as needed (migraine headache). Max 1 tablet in 24 hours. 8 tablet 2  ? SUMAtriptan (IMITREX) 100 MG tablet Take 1 tablet (100 mg total) by mouth every 2 (two) hours as needed for migraine. May repeat in 2 hours if headache persists or recurs. 10 tablet 3  ? ?No current facility-administered medications for this visit.  ? ? ?Patient  confirms/reports the following allergies:  ?Allergies  ?Allergen Reactions  ? Sulfa Antibiotics Other (See Comments)  ?  Syncope   ? ? ?No orders of the defined types were placed in this encounter. ? ? ?AUTHORIZATION INFORMATION ?Primary Insurance: ?1D#: ?Group #: ? ?Secondary Insurance: ?1D#: ?Group #: ? ?SCHEDULE INFORMATION: ?Date: 07/07/21 ?Time: ?Location: [facility] ?

## 2021-07-01 ENCOUNTER — Telehealth: Payer: Self-pay | Admitting: Family Medicine

## 2021-07-01 NOTE — Telephone Encounter (Signed)
Rthy is Public librarian from [place] received a request for [place] 75 MG TBDP [id number] However unable to accept request because stamp of Loni Muse, CMA. Needing new script sent over with provider stamp or escript or fax. ? ?CB- [phone] ?[phone] ?

## 2021-07-07 ENCOUNTER — Ambulatory Visit
Admission: RE | Admit: 2021-07-07 | Discharge: 2021-07-07 | Disposition: A | Discharge status: Home / Self Care | Payer: BC Managed Care – PPO | Source: Ambulatory Visit | Attending: Gastroenterology | Admitting: Gastroenterology

## 2021-07-07 ENCOUNTER — Ambulatory Visit: Payer: BC Managed Care – PPO | Admitting: Anesthesiology

## 2021-07-07 ENCOUNTER — Encounter
Admission: RE | Disposition: A | Discharge status: Home / Self Care | Payer: Self-pay | Source: Ambulatory Visit | Attending: Gastroenterology

## 2021-07-07 DIAGNOSIS — D122 Benign neoplasm of ascending colon: Secondary | ICD-10-CM | POA: Insufficient documentation

## 2021-07-07 DIAGNOSIS — F17210 Nicotine dependence, cigarettes, uncomplicated: Secondary | ICD-10-CM | POA: Diagnosis not present

## 2021-07-07 DIAGNOSIS — Z1211 Encounter for screening for malignant neoplasm of colon: Secondary | ICD-10-CM | POA: Insufficient documentation

## 2021-07-07 DIAGNOSIS — D120 Benign neoplasm of cecum: Secondary | ICD-10-CM | POA: Diagnosis not present

## 2021-07-07 DIAGNOSIS — D125 Benign neoplasm of sigmoid colon: Secondary | ICD-10-CM | POA: Diagnosis not present

## 2021-07-07 DIAGNOSIS — K635 Polyp of colon: Secondary | ICD-10-CM | POA: Insufficient documentation

## 2021-07-07 DIAGNOSIS — G709 Myoneural disorder, unspecified: Secondary | ICD-10-CM | POA: Diagnosis not present

## 2021-07-07 DIAGNOSIS — R519 Headache, unspecified: Secondary | ICD-10-CM | POA: Insufficient documentation

## 2021-07-07 DIAGNOSIS — J45909 Unspecified asthma, uncomplicated: Secondary | ICD-10-CM | POA: Diagnosis not present

## 2021-07-07 DIAGNOSIS — F419 Anxiety disorder, unspecified: Secondary | ICD-10-CM | POA: Diagnosis not present

## 2021-07-07 HISTORY — PX: COLONOSCOPY WITH PROPOFOL: SHX5780

## 2021-07-07 SURGERY — COLONOSCOPY WITH PROPOFOL
Anesthesia: General

## 2021-07-07 MED ORDER — PROPOFOL 10 MG/ML IV BOLUS
INTRAVENOUS | Status: DC | PRN
  Administered 2021-07-07: 100 mg via INTRAVENOUS

## 2021-07-07 MED ORDER — SODIUM CHLORIDE 0.9 % IV SOLN: INTRAVENOUS | Status: DC

## 2021-07-07 MED ORDER — PROPOFOL 500 MG/50ML IV EMUL
INTRAVENOUS | Status: DC | PRN
Start: 2021-07-07 — End: 2021-07-07
  Administered 2021-07-07: 165 ug/kg/min via INTRAVENOUS

## 2021-07-07 MED ORDER — LIDOCAINE HCL (CARDIAC) PF 100 MG/5ML IV SOSY
PREFILLED_SYRINGE | INTRAVENOUS | Status: DC | PRN
Start: 2021-07-07 — End: 2021-07-07
  Administered 2021-07-07: 100 mg via INTRAVENOUS

## 2021-07-07 MED ORDER — PROPOFOL 500 MG/50ML IV EMUL
INTRAVENOUS | Status: AC
  Filled 2021-07-07: qty 50

## 2021-07-07 NOTE — Anesthesia Postprocedure Evaluation (Signed)
Anesthesia Post Note ? ?Patient: Megan Hawkins ? ?Procedure(s) Performed: COLONOSCOPY WITH PROPOFOL ? ?Patient location during evaluation: Endoscopy ?Anesthesia Type: General ?Level of consciousness: awake and alert ?Pain management: pain level controlled ?Vital Signs Assessment: post-procedure vital signs reviewed and stable ?Respiratory status: spontaneous breathing, nonlabored ventilation, respiratory function stable and patient connected to nasal cannula oxygen ?Cardiovascular status: blood pressure returned to baseline and stable ?Postop Assessment: no apparent nausea or vomiting ?Anesthetic complications: no ? ? ?No notable events documented. ? ? ?Last Vitals:  ?Vitals:  ? 07/07/21 1034 07/07/21 1043  ?BP: 116/74 116/74  ?Pulse: 77 74  ?Resp: 14 20  ?Temp:    ?SpO2: 97% 100%  ?  ?Last Pain:  ?Vitals:  ? 07/07/21 1034  ?TempSrc:   ?PainSc: 0-No pain  ? ? ?  ?  ?  ?  ?  ?  ? ?[name] [name] ? ? ? ? ?

## 2021-07-07 NOTE — Transfer of Care (Signed)
Immediate Anesthesia Transfer of Care Note ? ?Patient: Megan Hawkins ? ?Procedure(s) Performed: COLONOSCOPY WITH PROPOFOL ? ?Patient Location: Endoscopy Unit ? ?Anesthesia Type:General ? ?Level of Consciousness: awake ? ?Airway & Oxygen Therapy: Patient Spontanous Breathing ? ?Post-op Assessment: Report given to RN and Post -op Vital signs reviewed and stable ? ?Post vital signs: Reviewed and stable ? ?Last Vitals:  ?Vitals Value Taken Time  ?BP    ?Temp    ?Pulse    ?Resp    ?SpO2    ? ? ?Last Pain:  ?Vitals:  ? 07/07/21 1024  ?TempSrc: Temporal  ?PainSc: Asleep  ?   ? ?  ? ?Complications: No notable events documented. ?

## 2021-07-07 NOTE — Anesthesia Preprocedure Evaluation (Signed)
Anesthesia Evaluation  ?Patient identified by MRN, date of birth, ID band ?Patient awake ? ? ? ?Reviewed: ?Allergy & Precautions, NPO status , Patient's Chart, lab work & pertinent test results ? ?History of Anesthesia Complications ?Negative for: history of anesthetic complications ? ?Airway ?Mallampati: III ? ?TM Distance: <3 FB ?Neck ROM: full ? ? ? Dental ? ?(+) Chipped ?  ?Pulmonary ?neg shortness of breath, asthma , Current Smoker,  ?  ?Pulmonary exam normal ? ? ? ? ? ? ? Cardiovascular ?Exercise Tolerance: Good ?(-) angina(-) Past MI and (-) DOE negative cardio ROS ?Normal cardiovascular exam ? ? ?  ?Neuro/Psych ? Headaches, PSYCHIATRIC DISORDERS  Neuromuscular disease   ? GI/Hepatic ?negative GI ROS, Neg liver ROS, neg GERD  ,  ?Endo/Other  ?negative endocrine ROS ? Renal/GU ?negative Renal ROS  ?negative genitourinary ?  ?Musculoskeletal ? ? Abdominal ?  ?Peds ? Hematology ?negative hematology ROS ?(+)   ?Anesthesia Other Findings ?Past Medical History: ?No date: Allergy ?No date: Anxiety ?No date: Asthma ?01/2021: BRCA negative ?    Comment:  MyRisk neg except AXIN2 VUS; IBIS=7.1%/riskscore=12.9% ?No date: Colitis ?No date: Diverticulitis ?No date: Family history of ovarian cancer ?No date: Frequent headaches ? ?Past Surgical History: ?11/2010: BACK SURGERY ?    Comment:  T11-T12, hardware placement ?No date: CESAREAN SECTION ?10/2007: LAMINECTOMY AND MICRODISCECTOMY THORACIC SPINE ?    Comment:  T11-T12 ?2012: ULNAR NERVE REPAIR ? ?BMI   ? Body Mass Index: 40.13 kg/m?  ?  ? ? Reproductive/Obstetrics ?negative OB ROS ? ?  ? ? ? ? ? ? ? ? ? ? ? ? ? ?  ?  ? ? ? ? ? ? ? ? ?Anesthesia Physical ?Anesthesia Plan ? ?ASA: 3 ? ?Anesthesia Plan: General  ? ?Post-op Pain Management:   ? ?Induction: Intravenous ? ?PONV Risk Score and Plan: Propofol infusion and TIVA ? ?Airway Management Planned: Natural Airway and Nasal Cannula ? ?Additional Equipment:  ? ?Intra-op Plan:   ? ?Post-operative Plan:  ? ?Informed Consent: I have reviewed the patients History and Physical, chart, labs and discussed the procedure including the risks, benefits and alternatives for the proposed anesthesia with the patient or authorized representative who has indicated his/her understanding and acceptance.  ? ? ? ?Dental Advisory Given ? ?Plan Discussed with: Anesthesiologist, CRNA and Surgeon ? ?Anesthesia Plan Comments: (Patient consented for risks of anesthesia including but not limited to:  ?- adverse reactions to medications ?- risk of airway placement if required ?- damage to eyes, teeth, lips or other oral mucosa ?- nerve damage due to positioning  ?- sore throat or hoarseness ?- Damage to heart, brain, nerves, lungs, other parts of body or loss of life ? ?Patient voiced understanding.)  ? ? ? ? ? ? ?Anesthesia Quick Evaluation ? ?

## 2021-07-07 NOTE — Op Note (Signed)
[facility] ?Gastroenterology ?Patient Name: Megan Hawkins ?Procedure Date: 07/07/2021 9:55 AM ?MRN: [id number] ?Account #: [id number] ?Date of Birth: 1971-02-12 ?Admit Type: Outpatient ?Age: 51 ?Room: [facility] ENDO ROOM 4 ?Gender: Female ?Note Status: Finalized ?Instrument Name: Colonoscope [id number] ?Procedure:             Colonoscopy ?Indications:           Screening for colorectal malignant neoplasm ?Providers:             Jonathon Bellows MD, MD ?Referring MD:          Olin Hauser (Referring MD) ?Medicines:             Monitored Anesthesia Care ?Complications:         No immediate complications. ?Procedure:             Pre-Anesthesia Assessment: ?                       - Prior to the procedure, a History and Physical was  ?                       performed, and patient medications, allergies and  ?                       sensitivities were reviewed. The patient's tolerance  ?                       of previous anesthesia was reviewed. ?                       - The risks and benefits of the procedure and the  ?                       sedation options and risks were discussed with the  ?                       patient. All questions were answered and informed  ?                       consent was obtained. ?                       - ASA Grade Assessment: II - A patient with mild  ?                       systemic disease. ?                       After obtaining informed consent, the colonoscope was  ?                       passed under direct vision. Throughout the procedure,  ?                       the patient's blood pressure, pulse, and oxygen  ?                       saturations were monitored continuously. The  ?                       Colonoscope was introduced  through the anus and  ?                       advanced to the the cecum, identified by the  ?                       appendiceal orifice. The colonoscopy was performed  ?                       with ease. The patient tolerated the procedure  well.  ?                       The quality of the bowel preparation was excellent. ?Findings: ?     The perianal and digital rectal examinations were normal. ?     A 7 mm polyp was found in the sigmoid colon. The polyp was sessile. The  ?     polyp was removed with a cold snare. Resection and retrieval were  ?     complete. ?     Five sessile polyps were found in the ascending colon and cecum. The  ?     polyps were 6 to 9 mm in size. These polyps were removed with a cold  ?     snare. Resection and retrieval were complete. To prevent bleeding after  ?     the polypectomy, one hemostatic clip was successfully placed. There was  ?     no bleeding during, or at the end, of the procedure. ?     The exam was otherwise without abnormality on direct and retroflexion  ?     views. ?Impression:            - One 7 mm polyp in the sigmoid colon, removed with a  ?                       cold snare. Resected and retrieved. ?                       - Five 6 to 9 mm polyps in the ascending colon and in  ?                       the cecum, removed with a cold snare. Resected and  ?                       retrieved. Clip was placed. ?                       - The examination was otherwise normal on direct and  ?                       retroflexion views. ?Recommendation:        - Discharge patient to home (with escort). ?                       - Resume previous diet. ?                       - Continue present medications. ?                       - Await pathology  results. ?                       - Repeat colonoscopy for surveillance based on  ?                       pathology results. ?Procedure Code(s):     --- Professional --- ?                       [phone], Colonoscopy, flexible; with removal of  ?                       tumor(s), polyp(s), or other lesion(s) by snare  ?                       technique ?Diagnosis Code(s):     --- Professional --- ?                       K63.5, Polyp of colon ?                       Z12.11, Encounter for  screening for malignant neoplasm  ?                       of colon ?CPT copyright 2019 [employer] Medical [employer]. All rights reserved. ?The codes documented in this report are preliminary and upon coder review may  ?be revised to meet current compliance requirements. ?Jonathon Bellows, MD ?Jonathon Bellows MD, MD ?07/07/2021 10:22:48 AM ?This report has been signed electronically. ?Number of Addenda: 0 ?Note Initiated On: 07/07/2021 9:55 AM ?Scope Withdrawal Time: 0 hours 12 minutes 31 seconds  ?Total Procedure Duration: 0 hours 16 minutes 10 seconds  ?Estimated Blood Loss:  Estimated blood loss: none. ?     [facility] ?

## 2021-07-07 NOTE — H&P (Signed)
? ? ? ?Jonathon Bellows, MD ?[address] [place], [place], [place], [address] ?[address] [place], [place], [place], [address] ?Phone: [phone]  ?Fax: [phone] ? ?Primary Care Physician:  Olin Hauser, DO ? ? ?Pre-Procedure History & Physical: ?HPI:  Megan Hawkins is a 51 y.o. female is here for an colonoscopy. ?  ?Past Medical History:  ?Diagnosis Date  ? Allergy   ? Anxiety   ? Asthma   ? BRCA negative 01/2021  ? MyRisk neg except AXIN2 VUS; IBIS=7.1%/riskscore=12.9%  ? Colitis   ? Diverticulitis   ? Family history of ovarian cancer   ? Frequent headaches   ? ? ?Past Surgical History:  ?Procedure Laterality Date  ? BACK SURGERY  11/2010  ? T11-T12, hardware placement  ? CESAREAN SECTION    ? LAMINECTOMY AND MICRODISCECTOMY THORACIC SPINE  10/2007  ? T11-T12  ? ULNAR NERVE REPAIR  2012  ? ? ?Prior to Admission medications   ?Medication Sig Start Date End Date Taking? Authorizing Provider  ?cyclobenzaprine (FLEXERIL) 10 MG tablet TAKE 1 TABLET(10 MG) BY MOUTH THREE TIMES DAILY AS NEEDED FOR MUSCLE SPASMS 05/23/21  Yes Olin Hauser, DO  ?DULoxetine (CYMBALTA) 30 MG capsule Take 1 capsule (30 mg total) by mouth daily. 03/17/21  Yes Karamalegos, Devonne Doughty, DO  ?gabapentin (NEURONTIN) 300 MG capsule TAKE 1 CAPSULE(300 MG) BY MOUTH THREE TIMES DAILY 05/23/21  Yes Karamalegos, Devonne Doughty, DO  ?albuterol (VENTOLIN HFA) 108 (90 Base) MCG/ACT inhaler Inhale 1-2 puffs into the lungs every 6 (six) hours as needed for wheezing or shortness of breath. 01/12/21   Karamalegos, Devonne Doughty, DO  ?ibuprofen (ADVIL) 800 MG tablet Take 1 tablet (800 mg total) by mouth every 8 (eight) hours as needed for mild pain. 05/04/20   Ward, Delice Bison, DO  ?nortriptyline (PAMELOR) 10 MG capsule Take 20 mg by mouth at bedtime. 01/06/21   [provider]  ?NURTEC 75 MG TBDP Take 75 mg by mouth daily as needed (migraine headache). Max 1 tablet in 24 hours. 12/01/20   Karamalegos, Devonne Doughty, DO  ?SUMAtriptan  (IMITREX) 100 MG tablet Take 1 tablet (100 mg total) by mouth every 2 (two) hours as needed for migraine. May repeat in 2 hours if headache persists or recurs. 12/01/20   Olin Hauser, DO  ? ? ?Allergies as of 06/22/2021 - Review Complete 06/22/2021  ?Allergen Reaction Noted  ? Sulfa antibiotics Other (See Comments) 05/03/2020  ? ? ?Family History  ?Problem Relation Age of Onset  ? Heart failure Father   ? Ovarian cancer Paternal Aunt   ? Ovarian cancer Paternal Aunt   ? ? ?Social History  ? ?Socioeconomic History  ? Marital status: Significant Other  ?  Spouse name: Not on file  ? Number of children: Not on file  ? Years of education: Not on file  ? Highest education level: Not on file  ?Occupational History  ? Not on file  ?Tobacco Use  ? Smoking status: Every Day  ?  Packs/day: 0.50  ?  Types: Cigarettes  ? Smokeless tobacco: Never  ?Vaping Use  ? Vaping Use: Never used  ?Substance and Sexual Activity  ? Alcohol use: Not Currently  ? Drug use: Never  ? Sexual activity: Yes  ?Other Topics Concern  ? Not on file  ?Social History Narrative  ? Not on file  ? ?Social Determinants of Health  ? ?Financial Resource Strain: Not on file  ?Food Insecurity: Not on file  ?Transportation  Needs: Not on file  ?Physical Activity: Not on file  ?Stress: Not on file  ?Social Connections: Not on file  ?Intimate Partner Violence: Not on file  ? ? ?Review of Systems: ?See HPI, otherwise negative ROS ? ?Physical Exam: ?BP 113/83   Pulse 84   Temp (!) 96.1 ?F (35.6 ?C) (Temporal)   Resp 18   Ht 4' 10" (1.473 m)   Wt 87.1 kg   SpO2 97%   BMI 40.13 kg/m?  ?General:   Alert,  pleasant and cooperative in NAD ?Head:  Normocephalic and atraumatic. ?Neck:  Supple; no masses or thyromegaly. ?Lungs:  Clear throughout to auscultation, normal respiratory effort.    ?Heart:  +S1, +S2, Regular rate and rhythm, No edema. ?Abdomen:  Soft, nontender and nondistended. Normal bowel sounds, without guarding, and without rebound.    ?Neurologic:  Alert and  oriented x4;  grossly normal neurologically. ? ?Impression/Plan: ?Megan Hawkins is here for an colonoscopy to be performed for Screening colonoscopy average risk   ?Risks, benefits, limitations, and alternatives regarding  colonoscopy have been reviewed with the patient.  Questions have been answered.  All parties agreeable. ? ? ? , MD  07/07/2021, 9:52 AM  ?

## 2021-07-08 ENCOUNTER — Encounter: Payer: Self-pay | Admitting: Gastroenterology

## 2021-07-08 LAB — SURGICAL PATHOLOGY

## 2021-07-11 ENCOUNTER — Encounter: Payer: Self-pay | Admitting: Gastroenterology

## 2021-07-14 ENCOUNTER — Ambulatory Visit: Payer: BC Managed Care – PPO | Admitting: Surgery

## 2021-07-14 ENCOUNTER — Encounter: Payer: Self-pay | Admitting: Surgery

## 2021-07-14 ENCOUNTER — Other Ambulatory Visit: Payer: Self-pay

## 2021-07-14 VITALS — BP 111/78 | HR 87 | Temp 98.4°F | Ht <= 58 in | Wt 192.0 lb

## 2021-07-14 DIAGNOSIS — L720 Epidermal cyst: Secondary | ICD-10-CM | POA: Insufficient documentation

## 2021-07-14 NOTE — Patient Instructions (Addendum)
Please see your appointment listed below.   We have removed a Cyst in our office today.  You have sutures under the skin that will dissolve and also dermabond (skin glue) on top of your skin which will come off on it's own in 10-14 days.  You may shower in 48 hours. Allow water to run over the area but do not scrub the area.  Avoid Strenuous activities that will make you sweat during the next 48 hours to avoid the glue coming off prematurely. Avoid activities that will place pressure to this area of the body for 1-2 weeks to avoid re-injury to incision site.  Please see your follow-up appointment provided. We will see you back in office to make sure this area is healed and to review the final pathology. If you have any questions or concerns prior to this appointment, call our office and speak with a nurse.    Excision of Skin Cysts or Lesions Excision of a skin lesion refers to the removal of a section of skin by making small cuts (incisions) in the skin. This procedure may be done to remove a cancerous (malignant) or noncancerous (benign) growth on the skin. It is typically done to treat or prevent cancer or infection. It may also be done to improve cosmetic appearance. The procedure may be done to remove: Cancerous growths, such as basal cell carcinoma, squamous cell carcinoma, or melanoma. Noncancerous growths, such as a cyst or lipoma. Growths, such as moles or skin tags, which may be removed for cosmetic reasons.  Various excision or surgical techniques may be used depending on your condition, the location of the lesion, and your overall health. Tell a health care provider about: Any allergies you have. All medicines you are taking, including vitamins, herbs, eye drops, creams, and over-the-counter medicines. Any problems you or family members have had with anesthetic medicines. Any blood disorders you have. Any surgeries you have had. Any medical conditions you have. Whether you  are pregnant or may be pregnant. What are the risks? Generally, this is a safe procedure. However, problems may occur, including: Bleeding. Infection. Scarring. Recurrence of the cyst, lipoma, or cancer. Changes in skin sensation or appearance, such as discoloration or swelling. Reaction to the anesthetics. Allergic reaction to surgical materials or ointments. Damage to nerves, blood vessels, muscles, or other structures. Continued pain.  What happens before the procedure? Ask your health care provider about: Changing or stopping your regular medicines. This is especially important if you are taking diabetes medicines or blood thinners. Taking medicines such as aspirin and ibuprofen. These medicines can thin your blood. Do not take these medicines before your procedure if your health care provider instructs you not to. You may be asked to take certain medicines. You may be asked to stop smoking. You may have an exam or testing. Plan to have someone take you home after the procedure. Plan to have someone help you with activities during recovery. What happens during the procedure? To reduce your risk of infection: Your health care team will wash or sanitize their hands. Your skin will be washed with soap. You will be given a medicine to numb the area (local anesthetic). One of the following excision techniques will be performed. At the end of any of these procedures, antibiotic ointment will be applied as needed. Each of the following techniques may vary among health care providers and hospitals. Complete Surgical Excision The area of skin that needs to be removed will be marked with a  pen. Using a small scalpel or scissors, the surgeon will gently cut around and under the lesion until it is completely removed. The lesion will be placed in a fluid and sent to the lab for examination. If necessary, bleeding will be controlled with a device that delivers heat (electrocautery). The edges of  the wound may be stitched (sutured) together, and a bandage (dressing) will be applied. This procedure may be performed to treat a cancerous growth or a noncancerous cyst or lesion. Excision of a Cyst The surgeon will make an incision on the cyst. The entire cyst will be removed through the incision. The incision may be closed with sutures. Shave Excision During shave excision, the surgeon will use a small blade or an electrically heated loop instrument to shave off the lesion. This may be done to remove a mole or a skin tag. The wound will usually be left to heal on its own without sutures. Punch Excision During punch excision, the surgeon will use a small tool that is like a cookie cutter or a hole punch to cut a circle shape out of the skin. The outer edges of the skin will be sutured together. This may be done to remove a mole or a scar or to perform a biopsy of the lesion. Mohs Micrographic Surgery During Mohs micrographic surgery, layers of the lesion will be removed with a scalpel or a loop instrument and will be examined right away under a microscope. Layers will be removed until all of the abnormal or cancerous tissue has been removed. This procedure is minimally invasive, and it ensures the best cosmetic outcome. It involves the removal of as little normal tissue as possible. Mohs is usually done to treat skin cancer, such as basal cell carcinoma or squamous cell carcinoma, particularly on the face and ears. Depending on the size of the surgical wound, it may be sutured closed. What happens after the procedure? Return to your normal activities as told by your health care provider. Talk with your health care provider to discuss any test results, treatment options, and if necessary, the need for more tests. This information is not intended to replace advice given to you by your health care provider. Make sure you discuss any questions you have with your health care provider. Document Released:  05/10/2009 Document Revised: 07/22/2015 Document Reviewed: 04/01/2014 Elsevier Interactive Patient Education  [employer].

## 2021-07-14 NOTE — Progress Notes (Signed)
Patient ID: Megan Hawkins, female   DOB: [birth year], 51 y.o.   MRN: [id number]  Chief Complaint: Scalp bump  History of Present Illness Megan Hawkins is a 51 y.o. female with uncertain duration of progressively enlarging lump on the central/dome area of her scalp.  Apparently her hairdresser encounters it frequently.  It has progressed in size but is been quite stable without any evidence of discharge, inflammation, bleeding etc.  No prior attempts at excision or incision and drainage.  Annoying with mild tenderness.  Past Medical History Past Medical History:  Diagnosis Date   Allergy    Anxiety    Asthma    BRCA negative 01/2021   MyRisk neg except AXIN2 VUS; IBIS=7.1%/riskscore=12.9%   Colitis    Diverticulitis    Family history of ovarian cancer    Frequent headaches       Past Surgical History:  Procedure Laterality Date   BACK SURGERY  11/2010   T11-T12, hardware placement   CESAREAN SECTION     COLONOSCOPY WITH PROPOFOL N/A 07/07/2021   Procedure: COLONOSCOPY WITH PROPOFOL;  Surgeon: Jonathon Bellows, MD;  Location: [facility];  Service: Gastroenterology;  Laterality: N/A;   LAMINECTOMY AND MICRODISCECTOMY THORACIC SPINE  10/2007   T11-T12   ULNAR NERVE REPAIR  2012    Allergies  Allergen Reactions   Sulfa Antibiotics Other (See Comments)    Syncope     Current Outpatient Medications  Medication Sig Dispense Refill   albuterol (VENTOLIN HFA) 108 (90 Base) MCG/ACT inhaler Inhale 1-2 puffs into the lungs every 6 (six) hours as needed for wheezing or shortness of breath. 8 g 3   cyclobenzaprine (FLEXERIL) 10 MG tablet TAKE 1 TABLET(10 MG) BY MOUTH THREE TIMES DAILY AS NEEDED FOR MUSCLE SPASMS 90 tablet 2   DULoxetine (CYMBALTA) 30 MG capsule Take 1 capsule (30 mg total) by mouth daily. 30 capsule 3   gabapentin (NEURONTIN) 300 MG capsule TAKE 1 CAPSULE(300 MG) BY MOUTH THREE TIMES DAILY 90 capsule 1   ibuprofen (ADVIL) 800 MG tablet Take 1 tablet (800 mg total) by mouth  every 8 (eight) hours as needed for mild pain. 30 tablet 0   nortriptyline (PAMELOR) 10 MG capsule Take 20 mg by mouth at bedtime.     NURTEC 75 MG TBDP Take 75 mg by mouth daily as needed (migraine headache). Max 1 tablet in 24 hours. 8 tablet 2   SUMAtriptan (IMITREX) 100 MG tablet Take 1 tablet (100 mg total) by mouth every 2 (two) hours as needed for migraine. May repeat in 2 hours if headache persists or recurs. 10 tablet 3   No current facility-administered medications for this visit.    Family History Family History  Problem Relation Age of Onset   Heart failure Father    Ovarian cancer Paternal Aunt    Ovarian cancer Paternal Aunt       Social History Social History   Tobacco Use   Smoking status: Every Day    Packs/day: 0.50    Types: Cigarettes   Smokeless tobacco: Never  Vaping Use   Vaping Use: Never used  Substance Use Topics   Alcohol use: Not Currently   Drug use: Never        Review of Systems  Psychiatric/Behavioral:  Positive for depression.   All other systems reviewed and are negative.   Physical Exam Blood pressure 111/78, pulse 87, temperature 98.4 F (36.9 C), temperature source Oral, height $RemoveBefo'4\' 10"'KvHNIGhGfEa$  (1.473 m), weight 192 lb (87.1 kg), SpO2  96 %. Last Weight  Most recent update: 07/14/2021  9:57 AM    Weight  87.1 kg (192 lb)             CONSTITUTIONAL: Well developed, and nourished, appropriately responsive and aware without distress.   EYES: Sclera non-icteric.   EARS, NOSE, MOUTH AND THROAT:  The oropharynx is clear. Oral mucosa is pink and moist.    Hearing is intact to voice.  NECK: Trachea is midline, and there is no jugular venous distension.  LYMPH NODES:  Lymph nodes in the neck are not enlarged. RESPIRATORY:  Lungs are clear, and breath sounds are equal bilaterally. Normal respiratory effort without pathologic use of accessory muscles. CARDIOVASCULAR: Heart is regular in rate and rhythm. GI: The abdomen is soft, nontender, and  nondistended.  MUSCULOSKELETAL:  Symmetrical muscle tone appreciated in all four extremities.     SKIN: Skin turgor is normal. No pathologic skin lesions appreciated.  2.2 cm scalp lesion, mobile, probable cyst.  Firm non-fluctuant.  No induration or erythema.   NEUROLOGIC:  Motor and sensation appear grossly normal.  Cranial nerves are grossly without defect. PSYCH:  Alert and oriented to person, place and time. Affect is appropriate for situation.  Data Reviewed I have personally reviewed what is currently available of the patient's imaging, recent labs and medical records.   Labs:     Latest Ref Rng & Units 03/10/2021    8:07 AM 11/09/2020    8:46 PM 07/27/2020   10:29 AM  CBC  WBC 3.8 - 10.8 Thousand/uL 11.9   9.2   9.2    Hemoglobin 11.7 - 15.5 g/dL 15.3   16.0   15.5    Hematocrit 35.0 - 45.0 % 44.9   45.3   45.1    Platelets 140 - 400 Thousand/uL 242   248   259        Latest Ref Rng & Units 03/10/2021    8:07 AM 11/09/2020    8:46 PM 07/27/2020   10:29 AM  CMP  Glucose 65 - 99 mg/dL 113   92   116    BUN 7 - 25 mg/dL $Remove'12   9   10    'dBcjSKv$ Creatinine 0.50 - 1.03 mg/dL 0.85   0.73   0.76    Sodium 135 - 146 mmol/L 139   137   139    Potassium 3.5 - 5.3 mmol/L 4.2   4.0   3.8    Chloride 98 - 110 mmol/L 106   104   106    CO2 20 - 32 mmol/L $RemoveB'28   24   24    'KTDZPwox$ Calcium 8.6 - 10.4 mg/dL 8.8   8.5   8.8    Total Protein 6.1 - 8.1 g/dL 6.6      Total Bilirubin 0.2 - 1.2 mg/dL 0.4      AST 10 - 35 U/L 15      ALT 6 - 29 U/L 17          Imaging:  Within last 24 hrs: No results found.  Assessment     Patient Active Problem List   Diagnosis Date Noted   Epidermoid cyst of skin of scalp 07/14/2021   Major depressive disorder, recurrent, moderate ([place]) 03/17/2021   Pre-diabetes 03/17/2021   Obesity (BMI 35.0-39.9 without comorbidity) 03/17/2021   Fibromyalgia 03/17/2021   Headache disorder 01/06/2021   Migraine with aura and without status migrainosus, not intractable 12/01/2020    DDD (degenerative  disc disease), cervical 12/01/2020   Cervical spinal stenosis 12/01/2020   DDD (degenerative disc disease), lumbar 12/01/2020   Hx of decompressive lumbar laminectomy 04/17/2018   Persons encountering health services in other specified circumstances 04/05/2011   Nicotine dependence, unspecified, uncomplicated 11/30/4959    Plan    Risks and benefits of excision of probable epidermal inclusion cyst of mid scalp discussed with patient.  She reports no allergies or prior reactions to lidocaine with epinephrine.  With informed consent obtained, her scalp was then prepped and draped in usual sterile manner while she is sitting in a semi-Fowler position.  Local infiltration of 1% lidocaine with epinephrine is performed to adequate anesthetic effect.  Elliptical incision is made to excise the overlying dermis.  The scalp cyst was opened and identified seborrheic contents consistent with inclusion cyst.  The remaining cyst wall was then sharply excised from the adjacent soft tissues and underlying dermis.  Incision was then closed with 2 simple sutures of 3-0 nylon.  Antibiotic ointment applied.  Hemostasis had been obtained with pressure.  Instructions given.  She will follow-up in 5 days for suture removal. She may shower she may use baby shampoo sparingly.  Face-to-face time spent with the patient and accompanying care providers(if present) was 35 minutes, with more than 50% of the time spent counseling, educating, and coordinating care of the patient.    These notes generated with voice recognition software. I apologize for typographical errors.  Ronny Bacon M.D., FACS 07/14/2021, 11:16 AM

## 2021-07-21 ENCOUNTER — Ambulatory Visit (INDEPENDENT_AMBULATORY_CARE_PROVIDER_SITE_OTHER): Payer: BC Managed Care – PPO | Admitting: Surgery

## 2021-07-21 ENCOUNTER — Encounter: Payer: Self-pay | Admitting: Surgery

## 2021-07-21 VITALS — BP 139/92 | HR 89 | Temp 98.4°F | Ht <= 58 in | Wt 192.0 lb

## 2021-07-21 DIAGNOSIS — L720 Epidermal cyst: Secondary | ICD-10-CM

## 2021-07-21 DIAGNOSIS — Z09 Encounter for follow-up examination after completed treatment for conditions other than malignant neoplasm: Secondary | ICD-10-CM

## 2021-07-21 NOTE — Patient Instructions (Signed)
You may wash your hair as usual, do not scrub at the scalp for the next two weeks. This will allow the area to fully heal.  Follow-up with our office as needed.  Please call and ask to speak with a nurse if you develop questions or concerns.

## 2021-07-21 NOTE — Progress Notes (Signed)
Removed sutures from scalp today.  No evidence of infection, drainage or poor healing.  She seems pleased with the removal of this lump. Glad to see her back again as needed or for any other surgical need.

## 2021-07-23 ENCOUNTER — Other Ambulatory Visit: Payer: Self-pay | Admitting: Family Medicine

## 2021-07-23 DIAGNOSIS — M797 Fibromyalgia: Secondary | ICD-10-CM

## 2021-07-23 DIAGNOSIS — F331 Major depressive disorder, recurrent, moderate: Secondary | ICD-10-CM

## 2021-07-23 DIAGNOSIS — M5030 Other cervical disc degeneration, unspecified cervical region: Secondary | ICD-10-CM

## 2021-07-23 DIAGNOSIS — G4486 Cervicogenic headache: Secondary | ICD-10-CM

## 2021-07-23 DIAGNOSIS — M5136 Other intervertebral disc degeneration, lumbar region: Secondary | ICD-10-CM

## 2021-07-23 DIAGNOSIS — M4802 Spinal stenosis, cervical region: Secondary | ICD-10-CM

## 2021-07-26 NOTE — Telephone Encounter (Signed)
Requested Prescriptions  Pending Prescriptions Disp Refills  . gabapentin (NEURONTIN) 300 MG capsule [Pharmacy Med Name: GABAPENTIN 300MG CAPSULES] 90 capsule 1    Sig: TAKE 1 CAPSULE(300 MG) BY MOUTH THREE TIMES DAILY     Neurology: Anticonvulsants - gabapentin Passed - 07/23/2021  3:19 AM      Passed - Cr in normal range and within 360 days    Creat  Date Value Ref Range Status  03/10/2021 0.85 0.50 - 1.03 mg/dL Final         Passed - Completed PHQ-2 or PHQ-9 in the last 360 days      Passed - Valid encounter within last 12 months    Recent Outpatient Visits          1 month ago Major depressive disorder, recurrent, moderate ([place])   [place], DO   4 months ago Annual physical exam   [place], DO   6 months ago Migraine with aura and without status migrainosus, not intractable   [place], DO   7 months ago Migraine with aura and without status migrainosus, not intractable   [place], DO      Future Appointments            In 1 month Parks Ranger, Devonne Doughty, DO [facility], [place]           . DULoxetine (CYMBALTA) 30 MG capsule [Pharmacy Med Name: DULOXETINE DR 30MG CAPSULES] 90 capsule 0    Sig: TAKE 1 CAPSULE(30 MG) BY MOUTH DAILY     Psychiatry: Antidepressants - SNRI - duloxetine Failed - 07/23/2021  3:19 AM      Failed - Last BP in normal range    BP Readings from Last 1 Encounters:  07/21/21 (!) 139/92         Passed - Cr in normal range and within 360 days    Creat  Date Value Ref Range Status  03/10/2021 0.85 0.50 - 1.03 mg/dL Final         Passed - eGFR is 30 or above and within 360 days    GFR, Estimated  Date Value Ref Range Status  11/09/2020 >60 >60 mL/min Final    Comment:    (NOTE) Calculated using the CKD-EPI Creatinine Equation (2021)    eGFR  Date Value  Ref Range Status  03/10/2021 82 > OR = 60 mL/min/1.59m Final    Comment:    The eGFR is based on the CKD-EPI 2021 equation. To calculate  the new eGFR from a previous Creatinine or Cystatin C result, go to https://www.kidney.org/professionals/ kdoqi/gfr%5Fcalculator          Passed - Completed PHQ-2 or PHQ-9 in the last 360 days      Passed - Valid encounter within last 6 months    Recent Outpatient Visits          1 month ago Major depressive disorder, recurrent, moderate ([place]   [place]   4 months ago Annual physical exam   [place]   6 months ago Migraine with aura and without status migrainosus, not intractable   [place]   7 months ago Migraine with aura and without status migrainosus, not intractable   [place]  Future Appointments            In 1 month Karamalegos, Devonne Doughty, DO [facility], [facility]

## 2021-08-08 ENCOUNTER — Ambulatory Visit
Admission: EM | Admit: 2021-08-08 | Discharge: 2021-08-08 | Disposition: A | Discharge status: Home / Self Care | Payer: BC Managed Care – PPO | Attending: Emergency Medicine | Admitting: Emergency Medicine

## 2021-08-08 DIAGNOSIS — H9202 Otalgia, left ear: Secondary | ICD-10-CM

## 2021-08-08 DIAGNOSIS — J069 Acute upper respiratory infection, unspecified: Secondary | ICD-10-CM | POA: Diagnosis not present

## 2021-08-08 DIAGNOSIS — J029 Acute pharyngitis, unspecified: Secondary | ICD-10-CM

## 2021-08-08 LAB — POCT RAPID STREP A (OFFICE): Rapid Strep A Screen: NEGATIVE

## 2021-08-08 NOTE — ED Provider Notes (Signed)
[facility]    CSN: [id number] Arrival date & time: 08/08/21  1203      History   Chief Complaint Chief Complaint  Patient presents with   Otalgia    Left ear    Sore Throat   Facial Pain    HPI Megan Hawkins is a 51 y.o. female.  Patient presents with 3-day history of left ear pain, sore throat congestion, sinus pressure, mild occasional cough.  Treatment at home with ibuprofen taken 2 days ago; no OTC medications taken today.  No fever, rash, shortness of breath, vomiting, diarrhea, or other symptoms.  Her medical history includes asthma, allergies, prediabetes, fibromyalgia, migraine headaches, cervical spine stenosis, degenerative disc disease, obesity.  Current everyday smoker.  The history is provided by the patient and medical records.    Past Medical History:  Diagnosis Date   Allergy    Anxiety    Asthma    BRCA negative 01/2021   MyRisk neg except AXIN2 VUS; IBIS=7.1%/riskscore=12.9%   Colitis    Diverticulitis    Family history of ovarian cancer    Frequent headaches     Patient Active Problem List   Diagnosis Date Noted   Epidermoid cyst of skin of scalp 07/14/2021   Major depressive disorder, recurrent, moderate ([place]) 03/17/2021   Pre-diabetes 03/17/2021   Obesity (BMI 35.0-39.9 without comorbidity) 03/17/2021   Fibromyalgia 03/17/2021   Headache disorder 01/06/2021   Migraine with aura and without status migrainosus, not intractable 12/01/2020   DDD (degenerative disc disease), cervical 12/01/2020   Cervical spinal stenosis 12/01/2020   DDD (degenerative disc disease), lumbar 12/01/2020   Hx of decompressive lumbar laminectomy 04/17/2018   Persons encountering health services in other specified circumstances 04/05/2011   Nicotine dependence, unspecified, uncomplicated 02/33/4356    Past Surgical History:  Procedure Laterality Date   BACK SURGERY  11/2010   T11-T12, hardware placement   CESAREAN SECTION     COLONOSCOPY WITH  PROPOFOL N/A 07/07/2021   Procedure: COLONOSCOPY WITH PROPOFOL;  Surgeon: Jonathon Bellows, MD;  Location: [facility]COPY;  Service: Gastroenterology;  Laterality: N/A;   LAMINECTOMY AND MICRODISCECTOMY THORACIC SPINE  10/2007   T11-T12   ULNAR NERVE REPAIR  2012    OB History     Gravida  6   Para  4   Term  3   Preterm  1   AB  1   Living  5      SAB  1   IAB      Ectopic      Multiple  1   Live Births               Home Medications    Prior to Admission medications   Medication Sig Start Date End Date Taking? Authorizing Provider  albuterol (VENTOLIN HFA) 108 (90 Base) MCG/ACT inhaler Inhale 1-2 puffs into the lungs every 6 (six) hours as needed for wheezing or shortness of breath. 01/12/21   Karamalegos, Devonne Doughty, DO  cyclobenzaprine (FLEXERIL) 10 MG tablet TAKE 1 TABLET(10 MG) BY MOUTH THREE TIMES DAILY AS NEEDED FOR MUSCLE SPASMS 05/23/21   Parks Ranger, Devonne Doughty, DO  DULoxetine (CYMBALTA) 30 MG capsule TAKE 1 CAPSULE(30 MG) BY MOUTH DAILY 07/26/21   Karamalegos, Devonne Doughty, DO  gabapentin (NEURONTIN) 300 MG capsule TAKE 1 CAPSULE(300 MG) BY MOUTH THREE TIMES DAILY 07/26/21   Parks Ranger, Devonne Doughty, DO  ibuprofen (ADVIL) 800 MG tablet Take 1 tablet (800 mg total) by mouth every 8 (eight) hours  as needed for mild pain. 05/04/20   Ward, Delice Bison, DO  nortriptyline (PAMELOR) 10 MG capsule Take 20 mg by mouth at bedtime. 01/06/21   [provider]  NURTEC 75 MG TBDP Take 75 mg by mouth daily as needed (migraine headache). Max 1 tablet in 24 hours. 12/01/20   Karamalegos, Devonne Doughty, DO  SUMAtriptan (IMITREX) 100 MG tablet Take 1 tablet (100 mg total) by mouth every 2 (two) hours as needed for migraine. May repeat in 2 hours if headache persists or recurs. 12/01/20   Olin Hauser, DO    Family History Family History  Problem Relation Age of Onset   Heart failure Father    Ovarian cancer Paternal Aunt    Ovarian cancer Paternal Aunt      Social History Social History   Tobacco Use   Smoking status: Every Day    Packs/day: 0.50    Types: Cigarettes    Passive exposure: Past   Smokeless tobacco: Never  Vaping Use   Vaping Use: Never used  Substance Use Topics   Alcohol use: Not Currently   Drug use: Never     Allergies   Sulfa antibiotics   Review of Systems Review of Systems  Constitutional:  Negative for chills and fever.  HENT:  Positive for congestion, ear pain, sinus pressure and sore throat.   Respiratory:  Positive for cough. Negative for shortness of breath.   Cardiovascular:  Negative for chest pain and palpitations.  Gastrointestinal:  Negative for diarrhea and vomiting.  Skin:  Negative for color change and rash.  All other systems reviewed and are negative.    Physical Exam Triage Vital Signs ED Triage Vitals  Enc Vitals Group     BP      Pulse      Resp      Temp      Temp src      SpO2      Weight      Height      Head Circumference      Peak Flow      Pain Score      Pain Loc      Pain Edu?      Excl. in [place]?    No data found.  Updated Vital Signs BP 122/87   Pulse 86   Temp 97.9 F (36.6 C)   Resp 18   SpO2 95%   Visual Acuity Right Eye Distance:   Left Eye Distance:   Bilateral Distance:    Right Eye Near:   Left Eye Near:    Bilateral Near:     Physical Exam Vitals and nursing note reviewed.  Constitutional:      General: She is not in acute distress.    Appearance: She is well-developed. She is not ill-appearing.  HENT:     Right Ear: Tympanic membrane and ear canal normal.     Left Ear: Tympanic membrane and ear canal normal.     Nose: Nose normal.     Mouth/Throat:     Mouth: Mucous membranes are moist.     Pharynx: Posterior oropharyngeal erythema present.  Cardiovascular:     Rate and Rhythm: Normal rate and regular rhythm.     Heart sounds: Normal heart sounds.  Pulmonary:     Effort: Pulmonary effort is normal. No respiratory distress.      Breath sounds: Normal breath sounds.  Musculoskeletal:     Cervical back: Neck supple.  Skin:  General: Skin is warm and dry.  Neurological:     Mental Status: She is alert.  Psychiatric:        Mood and Affect: Mood normal.        Behavior: Behavior normal.      UC Treatments / Results  Labs (all labs ordered are listed, but only abnormal results are displayed) Labs Reviewed  POCT RAPID STREP A (OFFICE)    EKG   Radiology No results found.  Procedures Procedures (including critical care time)  Medications Ordered in UC Medications - No data to display  Initial Impression / Assessment and Plan / UC Course  I have reviewed the triage vital signs and the nursing notes.  Pertinent labs & imaging results that were available during my care of the patient were reviewed by me and considered in my medical decision making (see chart for details).    Sore throat, left otalgia, viral URI.  Rapid strep negative.  Patient declines COVID test.  Discussed symptomatic treatment including Tylenol or ibuprofen.  Instructed patient to follow up with her PCP if her symptoms are not improving.  She agrees to plan of care.    Final Clinical Impressions(s) / UC Diagnoses   Final diagnoses:  Sore throat  Otalgia of left ear  Viral URI     Discharge Instructions      The strep test is negative.    Take Tylenol or ibuprofen as needed for fever or discomfort.  Rest and keep yourself hydrated.    Follow-up with your primary care provider if your symptoms are not improving.         ED Prescriptions   None    PDMP not reviewed this encounter.   Sharion Balloon, NP 08/08/21 1308

## 2021-08-08 NOTE — ED Triage Notes (Signed)
Patient presents to Urgent Care with complaints of left ear pain, facial pressure, and sore throat x 3 days. Took one dose of ibuprofen Sat. Last known fever Sat.   Denies ear drainage.

## 2021-08-08 NOTE — Discharge Instructions (Addendum)
The strep test is negative.    Take Tylenol or ibuprofen as needed for fever or discomfort.  Rest and keep yourself hydrated.    Follow-up with your primary care provider if your symptoms are not improving.

## 2021-08-31 DIAGNOSIS — M542 Cervicalgia: Secondary | ICD-10-CM | POA: Diagnosis not present

## 2021-08-31 DIAGNOSIS — R519 Headache, unspecified: Secondary | ICD-10-CM | POA: Diagnosis not present

## 2021-08-31 DIAGNOSIS — M545 Low back pain, unspecified: Secondary | ICD-10-CM | POA: Diagnosis not present

## 2021-09-03 ENCOUNTER — Other Ambulatory Visit: Payer: Self-pay | Admitting: Family Medicine

## 2021-09-03 DIAGNOSIS — M4802 Spinal stenosis, cervical region: Secondary | ICD-10-CM

## 2021-09-03 DIAGNOSIS — M5136 Other intervertebral disc degeneration, lumbar region: Secondary | ICD-10-CM

## 2021-09-03 DIAGNOSIS — M5030 Other cervical disc degeneration, unspecified cervical region: Secondary | ICD-10-CM

## 2021-09-03 DIAGNOSIS — G4486 Cervicogenic headache: Secondary | ICD-10-CM

## 2021-09-05 ENCOUNTER — Ambulatory Visit
Admission: EM | Admit: 2021-09-05 | Discharge: 2021-09-05 | Disposition: A | Discharge status: Home / Self Care | Payer: BC Managed Care – PPO | Attending: Emergency Medicine | Admitting: Emergency Medicine

## 2021-09-05 DIAGNOSIS — R42 Dizziness and giddiness: Secondary | ICD-10-CM | POA: Diagnosis not present

## 2021-09-05 DIAGNOSIS — J4521 Mild intermittent asthma with (acute) exacerbation: Secondary | ICD-10-CM

## 2021-09-05 DIAGNOSIS — R0602 Shortness of breath: Secondary | ICD-10-CM

## 2021-09-05 MED ORDER — PREDNISONE 10 MG PO TABS: 40.0000 mg | ORAL_TABLET | Freq: Every day | ORAL | 0 refills | Status: AC

## 2021-09-05 NOTE — ED Triage Notes (Signed)
Patient presents to Urgent Care with complaints of chest congestion and light headedness x 1 week. Treating symptoms with mucinex, albuterol inhlaer, vicks cough drops, and nyquil.

## 2021-09-05 NOTE — Telephone Encounter (Signed)
Requested Prescriptions  Pending Prescriptions Disp Refills  . gabapentin (NEURONTIN) 300 MG capsule [Pharmacy Med Name: GABAPENTIN '300MG'$  CAPSULES] 90 capsule 1    Sig: TAKE 1 CAPSULE(300 MG) BY MOUTH THREE TIMES DAILY     Neurology: Anticonvulsants - gabapentin Passed - 09/03/2021 10:08 PM      Passed - Cr in normal range and within 360 days    Creat  Date Value Ref Range Status  03/10/2021 0.85 0.50 - 1.03 mg/dL Final         Passed - Completed PHQ-2 or PHQ-9 in the last 360 days      Passed - Valid encounter within last 12 months    Recent Outpatient Visits          2 months ago Major depressive disorder, recurrent, moderate ([place])   [place], DO   5 months ago Annual physical exam   [place], DO   7 months ago Migraine with aura and without status migrainosus, not intractable   [place], DO   9 months ago Migraine with aura and without status migrainosus, not intractable   [facility] [place], Devonne Doughty, DO      Future Appointments            In 2 weeks Parks Ranger, Devonne Doughty, [facility], [facility]

## 2021-09-05 NOTE — ED Provider Notes (Signed)
Megan Hawkins    CSN: [id number] Arrival date & time: 09/05/21  0841      History   Chief Complaint Chief Complaint  Patient presents with   Dizziness   Nasal Congestion   Cough    HPI Megan Hawkins is a 51 y.o. female.  Patient presents with 1 week history of congestion and cough.  Her cough has become productive of unknown colored sputum.  She also reports lightheadedness, wheezing, and shortness of breath during coughing episodes.  No fever, rash, sore throat, chest pain, vomiting, diarrhea, or other symptoms.  She has been treating her symptoms at home with her albuterol inhaler and OTC cough medication.  Her medical history includes asthma, migraine headaches, fibromyalgia, degenerative disc disease, cervical spinal stenosis, history of lumbar laminectomy, depression, anxiety, prediabetes, diverticulitis, obesity, nicotine dependence.  The history is provided by the patient and medical records.    Past Medical History:  Diagnosis Date   Allergy    Anxiety    Asthma    BRCA negative 01/2021   MyRisk neg except AXIN2 VUS; IBIS=7.1%/riskscore=12.9%   Colitis    Diverticulitis    Family history of ovarian cancer    Frequent headaches     Patient Active Problem List   Diagnosis Date Noted   Epidermoid cyst of skin of scalp 07/14/2021   Major depressive disorder, recurrent, moderate ([place]) 03/17/2021   Pre-diabetes 03/17/2021   Obesity (BMI 35.0-39.9 without comorbidity) 03/17/2021   Fibromyalgia 03/17/2021   Headache disorder 01/06/2021   Migraine with aura and without status migrainosus, not intractable 12/01/2020   DDD (degenerative disc disease), cervical 12/01/2020   Cervical spinal stenosis 12/01/2020   DDD (degenerative disc disease), lumbar 12/01/2020   Hx of decompressive lumbar laminectomy 04/17/2018   Persons encountering health services in other specified circumstances 04/05/2011   Nicotine dependence, unspecified, uncomplicated 40/11/2723     Past Surgical History:  Procedure Laterality Date   BACK SURGERY  11/2010   T11-T12, hardware placement   CESAREAN SECTION     COLONOSCOPY WITH PROPOFOL N/A 07/07/2021   Procedure: COLONOSCOPY WITH PROPOFOL;  Surgeon: Jonathon Bellows, MD;  Location: [facility] ENDOSCOPY;  Service: Gastroenterology;  Laterality: N/A;   LAMINECTOMY AND MICRODISCECTOMY THORACIC SPINE  10/2007   T11-T12   ULNAR NERVE REPAIR  2012    OB History     Gravida  6   Para  4   Term  3   Preterm  1   AB  1   Living  5      SAB  1   IAB      Ectopic      Multiple  1   Live Births               Home Medications    Prior to Admission medications   Medication Sig Start Date End Date Taking? Authorizing Provider  predniSONE (DELTASONE) 10 MG tablet Take 4 tablets (40 mg total) by mouth daily for 5 days. 09/05/21 09/10/21 Yes Sharion Balloon, NP  albuterol (VENTOLIN HFA) 108 (90 Base) MCG/ACT inhaler Inhale 1-2 puffs into the lungs every 6 (six) hours as needed for wheezing or shortness of breath. 01/12/21   Karamalegos, Devonne Doughty, DO  cyclobenzaprine (FLEXERIL) 10 MG tablet TAKE 1 TABLET(10 MG) BY MOUTH THREE TIMES DAILY AS NEEDED FOR MUSCLE SPASMS 05/23/21   Parks Ranger, Devonne Doughty, DO  DULoxetine (CYMBALTA) 30 MG capsule TAKE 1 CAPSULE(30 MG) BY MOUTH DAILY 07/26/21   Parks Ranger Devonne Doughty, DO  gabapentin (NEURONTIN) 300 MG capsule TAKE 1 CAPSULE(300 MG) BY MOUTH THREE TIMES DAILY 07/26/21   Parks Ranger, Devonne Doughty, DO  ibuprofen (ADVIL) 800 MG tablet Take 1 tablet (800 mg total) by mouth every 8 (eight) hours as needed for mild pain. 05/04/20   Ward, Delice Bison, DO  nortriptyline (PAMELOR) 10 MG capsule Take 20 mg by mouth at bedtime. 01/06/21   [provider]  NURTEC 75 MG TBDP Take 75 mg by mouth daily as needed (migraine headache). Max 1 tablet in 24 hours. 12/01/20   Karamalegos, Devonne Doughty, DO  SUMAtriptan (IMITREX) 100 MG tablet Take 1 tablet (100 mg total) by mouth every 2 (two)  hours as needed for migraine. May repeat in 2 hours if headache persists or recurs. 12/01/20   Olin Hauser, DO    Family History Family History  Problem Relation Age of Onset   Heart failure Father    Ovarian cancer Paternal Aunt    Ovarian cancer Paternal Aunt     Social History Social History   Tobacco Use   Smoking status: Every Day    Packs/day: 0.50    Types: Cigarettes    Passive exposure: Past   Smokeless tobacco: Never  Vaping Use   Vaping Use: Never used  Substance Use Topics   Alcohol use: Not Currently   Drug use: Never     Allergies   Sulfa antibiotics   Review of Systems Review of Systems  Constitutional:  Negative for chills and fever.  HENT:  Positive for congestion. Negative for ear pain and sore throat.   Respiratory:  Positive for cough, shortness of breath and wheezing.   Cardiovascular:  Negative for chest pain and palpitations.  Gastrointestinal:  Negative for diarrhea and vomiting.  Skin:  Negative for color change and rash.  All other systems reviewed and are negative.    Physical Exam Triage Vital Signs ED Triage Vitals  Enc Vitals Group     BP      Pulse      Resp      Temp      Temp src      SpO2      Weight      Height      Head Circumference      Peak Flow      Pain Score      Pain Loc      Pain Edu?      Excl. in [place]?    No data found.  Updated Vital Signs BP 106/76 (BP Location: Left Arm)   Pulse 85   Temp 97.8 F (36.6 C) (Temporal)   Resp 18   SpO2 96%   Visual Acuity Right Eye Distance:   Left Eye Distance:   Bilateral Distance:    Right Eye Near:   Left Eye Near:    Bilateral Near:     Physical Exam Vitals and nursing note reviewed.  Constitutional:      General: She is not in acute distress.    Appearance: She is well-developed. She is obese. She is not ill-appearing.  HENT:     Right Ear: Tympanic membrane normal.     Left Ear: Tympanic membrane normal.     Nose: Nose normal.      Mouth/Throat:     Mouth: Mucous membranes are moist.     Pharynx: Oropharynx is clear.  Cardiovascular:     Rate and Rhythm: Normal rate and regular rhythm.     Heart  sounds: Normal heart sounds.  Pulmonary:     Effort: Pulmonary effort is normal. No respiratory distress.     Breath sounds: Normal breath sounds. No wheezing.  Musculoskeletal:     Cervical back: Neck supple.  Skin:    General: Skin is warm and dry.  Neurological:     Mental Status: She is alert.  Psychiatric:        Mood and Affect: Mood normal.        Behavior: Behavior normal.      UC Treatments / Results  Labs (all labs ordered are listed, but only abnormal results are displayed) Labs Reviewed - No data to display  EKG   Radiology No results found.  Procedures Procedures (including critical care time)  Medications Ordered in UC Medications - No data to display  Initial Impression / Assessment and Plan / UC Course  I have reviewed the triage vital signs and the nursing notes.  Pertinent labs & imaging results that were available during my care of the patient were reviewed by me and considered in my medical decision making (see chart for details).    Asthma exacerbation, dizziness, shortness of breath.  Patient is well-appearing and her exam is reassuring.  Lungs are clear and O2 sat is 96% on room air.  Treating with prednisone and continued use of albuterol inhaler.  Instructed patient to follow-up with her PCP in the next couple of days.  ED precautions discussed.  Education provided on asthma, dizziness, shortness of breath.  Patient agrees to plan of care.  Final Clinical Impressions(s) / UC Diagnoses   Final diagnoses:  Mild intermittent asthma with acute exacerbation  Dizziness  Shortness of breath     Discharge Instructions      Continue to use your albuterol inhaler as directed.  Take the prednisone as directed.    Go to the emergency department if you have worsening symptoms.     Follow-up with your primary care provider in the next 2 to 3 days.         ED Prescriptions     Medication Sig Dispense Auth. Provider   predniSONE (DELTASONE) 10 MG tablet Take 4 tablets (40 mg total) by mouth daily for 5 days. 20 tablet Sharion Balloon, NP      PDMP not reviewed this encounter.   Sharion Balloon, NP 09/05/21 [phone]

## 2021-09-05 NOTE — Discharge Instructions (Addendum)
Continue to use your albuterol inhaler as directed.  Take the prednisone as directed.    Go to the emergency department if you have worsening symptoms.    Follow-up with your primary care provider in the next 2 to 3 days.

## 2021-09-21 ENCOUNTER — Ambulatory Visit (INDEPENDENT_AMBULATORY_CARE_PROVIDER_SITE_OTHER): Payer: BC Managed Care – PPO | Admitting: Family Medicine

## 2021-09-21 ENCOUNTER — Other Ambulatory Visit: Payer: Self-pay | Admitting: Family Medicine

## 2021-09-21 ENCOUNTER — Encounter: Payer: Self-pay | Admitting: Family Medicine

## 2021-09-21 DIAGNOSIS — E7800 Pure hypercholesterolemia, unspecified: Secondary | ICD-10-CM | POA: Insufficient documentation

## 2021-09-21 DIAGNOSIS — R7303 Prediabetes: Secondary | ICD-10-CM

## 2021-09-21 DIAGNOSIS — M5030 Other cervical disc degeneration, unspecified cervical region: Secondary | ICD-10-CM | POA: Diagnosis not present

## 2021-09-21 DIAGNOSIS — Z0000 Encounter for general adult medical examination without abnormal findings: Secondary | ICD-10-CM

## 2021-09-21 DIAGNOSIS — M4802 Spinal stenosis, cervical region: Secondary | ICD-10-CM

## 2021-09-21 DIAGNOSIS — G4486 Cervicogenic headache: Secondary | ICD-10-CM | POA: Diagnosis not present

## 2021-09-21 DIAGNOSIS — M5136 Other intervertebral disc degeneration, lumbar region: Secondary | ICD-10-CM

## 2021-09-21 MED ORDER — CYCLOBENZAPRINE HCL 10 MG PO TABS: 10.0000 mg | ORAL_TABLET | Freq: Three times a day (TID) | ORAL | 5 refills | Status: DC | PRN

## 2021-09-21 MED ORDER — CONTRAVE 8-90 MG PO TB12: ORAL_TABLET | ORAL | 0 refills | Status: DC

## 2021-09-21 NOTE — Patient Instructions (Addendum)
Thank you for coming to the office today.  Re ordered flexeril with refills +5  Start Contrave  Week 1: Take 1 tab daily with breakfast Week 2: Take 1 tab twice daily with meal Week 3: Take 2 tab with AM meal and 1 tab with PM meal Week 4+: Take 2 tabs twice daily with meals - continue for weight loss  [place] pharmacy, stay tuned for updates.  If doing well on this in future, we can scale back to Wellbutrin only.  DUE for FASTING BLOOD WORK (no food or drink after midnight before the lab appointment, only water or coffee without cream/sugar on the morning of)  SCHEDULE "Lab Only" visit in the morning at the clinic for lab draw in 6 MONTHS   - Make sure Lab Only appointment is at about 1 week before your next appointment, so that results will be available  For Lab Results, once available within 2-3 days of blood draw, you can can log in to MyChart online to view your results and a brief explanation. Also, we can discuss results at next follow-up visit.   Please schedule a Follow-up Appointment to: Return in about 6 months (around 03/24/2022) for 6 month fasting lab only then 1 week later Annual Physical.  If you have any other questions or concerns, please feel free to call the office or send a message through [place]. You may also schedule an earlier appointment if necessary.  Additionally, you may be receiving a survey about your experience at our office within a few days to 1 week by e-mail or mail. We value your feedback.  Nobie Putnam, DO [place]

## 2021-09-21 NOTE — Progress Notes (Signed)
Subjective:    Patient ID: Megan Hawkins, female    DOB: June 25, 1970, 51 y.o.   MRN: [id number]  Megan Hawkins is a 51 y.o. female presenting on 09/21/2021 for Obesity   HPI  Morbid Obesity BMI >40 PreDM Interested in GLP1 therapy weight loss management, last visit we investigated medication management and her ins does not cover wt management including GLP1 therapy since she is not a diabetic.  Weight gain 3 lbs in past 3+ months  She is interested in medication to curb appetite. She will continue to do lifestyle intervention diet and exercise.  Tobacco Active smoking  Neck Pain / Headaches Improved on medication. Requests Flexeril need order       09/21/2021    9:25 AM 06/22/2021    1:10 PM 03/17/2021    9:01 AM  Depression screen PHQ 2/9  Decreased Interest '1 1 1  '$ Down, Depressed, Hopeless 0 0 1  PHQ - 2 Score '1 1 2  '$ Altered sleeping '1 1 3  '$ Tired, decreased energy '1 1 3  '$ Change in appetite 0 1 3  Feeling bad or failure about yourself  0 0 0  Trouble concentrating 0 1 3  Moving slowly or fidgety/restless 0 0 0  Suicidal thoughts 0 0 0  PHQ-9 Score '3 5 14  '$ Difficult doing work/chores Not difficult at all Somewhat difficult Extremely dIfficult    Social History   Tobacco Use   Smoking status: Every Day    Packs/day: 0.50    Types: Cigarettes    Passive exposure: Past   Smokeless tobacco: Never  Vaping Use   Vaping Use: Never used  Substance Use Topics   Alcohol use: Not Currently   Drug use: Never    Review of Systems Per HPI unless specifically indicated above     Objective:    BP 132/83   Pulse 83   Ht '4\' 10"'$  (1.473 m)   Wt 195 lb 3.2 oz (88.5 kg)   SpO2 99%   BMI 40.80 kg/m   Wt Readings from Last 3 Encounters:  09/21/21 195 lb 3.2 oz (88.5 kg)  07/21/21 192 lb (87.1 kg)  07/14/21 192 lb (87.1 kg)    Physical Exam Vitals and nursing note reviewed.  Constitutional:      General: She is not in acute distress.    Appearance: Normal  appearance. She is well-developed. She is obese. She is not diaphoretic.     Comments: Well-appearing, comfortable, cooperative  HENT:     Head: Normocephalic and atraumatic.  Eyes:     General:        Right eye: No discharge.        Left eye: No discharge.     Conjunctiva/sclera: Conjunctivae normal.  Cardiovascular:     Rate and Rhythm: Normal rate.  Pulmonary:     Effort: Pulmonary effort is normal.  Skin:    General: Skin is warm and dry.     Findings: No erythema or rash.  Neurological:     Mental Status: She is alert and oriented to person, place, and time.  Psychiatric:        Mood and Affect: Mood normal.        Behavior: Behavior normal.        Thought Content: Thought content normal.     Comments: Well groomed, good eye contact, normal speech and thoughts       Results for orders placed or performed during the hospital encounter of 08/08/21  POCT  rapid strep A  Result Value Ref Range   Rapid Strep A Screen Negative Negative      Assessment & Plan:   Problem List Items Addressed This Visit     Cervical spinal stenosis   Relevant Medications   cyclobenzaprine (FLEXERIL) 10 MG tablet   DDD (degenerative disc disease), cervical   Relevant Medications   cyclobenzaprine (FLEXERIL) 10 MG tablet   DDD (degenerative disc disease), lumbar   Relevant Medications   cyclobenzaprine (FLEXERIL) 10 MG tablet   Morbid obesity (HCC) - Primary   Relevant Medications   CONTRAVE 8-90 MG TB12   Other Visit Diagnoses     Cervicogenic headache       Relevant Medications   cyclobenzaprine (FLEXERIL) 10 MG tablet        Re ordered flexeril with refills +5  Start Contrave (sample given 7 pills in office) Ordered to [facility] pharmacy  Week 1: Take 1 tab daily with breakfast Week 2: Take 1 tab twice daily with meal Week 3: Take 2 tab with AM meal and 1 tab with PM meal Week 4+: Take 2 tabs twice daily with meals - continue for weight loss  If doing well on  this in future, we can scale back to Wellbutrin only.  Meds ordered this encounter  Medications   CONTRAVE 8-90 MG TB12    Sig: Week 1: Take 1 tab daily with breakfast. Week 2: Take 1 tab twice daily with meal. Week 3: Take 2 tab with AM meal and 1 tab with PM meal. Week 4+: Take 2 tabs twice daily with meals - continue for weight loss    Dispense:  120 tablet    Refill:  0   cyclobenzaprine (FLEXERIL) 10 MG tablet    Sig: Take 1 tablet (10 mg total) by mouth 3 (three) times daily as needed for muscle spasms.    Dispense:  90 tablet    Refill:  5      Follow up plan: Return in about 6 months (around 03/24/2022) for 6 month fasting lab only then 1 week later Annual Physical.  Future labs ordered for 02/2022  Nobie Putnam, [facility] 09/21/2021, 9:46 AM

## 2021-09-26 DIAGNOSIS — R200 Anesthesia of skin: Secondary | ICD-10-CM | POA: Diagnosis not present

## 2021-09-26 DIAGNOSIS — R202 Paresthesia of skin: Secondary | ICD-10-CM | POA: Diagnosis not present

## 2021-10-30 ENCOUNTER — Other Ambulatory Visit: Payer: Self-pay | Admitting: Family Medicine

## 2021-10-30 DIAGNOSIS — F331 Major depressive disorder, recurrent, moderate: Secondary | ICD-10-CM

## 2021-10-30 DIAGNOSIS — M797 Fibromyalgia: Secondary | ICD-10-CM

## 2021-11-01 NOTE — Telephone Encounter (Signed)
Requested Prescriptions  Pending Prescriptions Disp Refills  . DULoxetine (CYMBALTA) 30 MG capsule [Pharmacy Med Name: DULOXETINE DR 30MG CAPSULES] 90 capsule 0    Sig: TAKE 1 CAPSULE(30 MG) BY MOUTH DAILY     Psychiatry: Antidepressants - SNRI - duloxetine Passed - 10/30/2021  3:16 AM      Passed - Cr in normal range and within 360 days    Creat  Date Value Ref Range Status  03/10/2021 0.85 0.50 - 1.03 mg/dL Final         Passed - eGFR is 30 or above and within 360 days    GFR, Estimated  Date Value Ref Range Status  11/09/2020 >60 >60 mL/min Final    Comment:    (NOTE) Calculated using the CKD-EPI Creatinine Equation (2021)    eGFR  Date Value Ref Range Status  03/10/2021 82 > OR = 60 mL/min/1.70m Final    Comment:    The eGFR is based on the CKD-EPI 2021 equation. To calculate  the new eGFR from a previous Creatinine or Cystatin C result, go to https://www.kidney.org/professionals/ kdoqi/gfr%5Fcalculator          Passed - Completed PHQ-2 or PHQ-9 in the last 360 days      Passed - Last BP in normal range    BP Readings from Last 1 Encounters:  09/21/21 132/83         Passed - Valid encounter within last 6 months    Recent Outpatient Visits          1 month ago Morbid obesity ([facility]   [place]   4 months ago Major depressive disorder, recurrent, moderate ([place]   [place]   7 months ago Annual physical exam   [place]   9 months ago Migraine with aura and without status migrainosus, not intractable   [place] DO   11 months ago Migraine with aura and without status migrainosus, not intractable   [place] DO      Future Appointments            In 4 months [place] [facility]

## 2021-12-01 ENCOUNTER — Encounter: Payer: Self-pay | Admitting: Family Medicine

## 2021-12-01 ENCOUNTER — Ambulatory Visit: Payer: BC Managed Care – PPO | Admitting: Family Medicine

## 2021-12-01 VITALS — BP 106/81 | HR 84 | Ht <= 58 in | Wt 194.6 lb

## 2021-12-01 DIAGNOSIS — M5136 Other intervertebral disc degeneration, lumbar region: Secondary | ICD-10-CM | POA: Diagnosis not present

## 2021-12-01 DIAGNOSIS — M5441 Lumbago with sciatica, right side: Secondary | ICD-10-CM

## 2021-12-01 DIAGNOSIS — M5442 Lumbago with sciatica, left side: Secondary | ICD-10-CM | POA: Diagnosis not present

## 2021-12-01 DIAGNOSIS — M5030 Other cervical disc degeneration, unspecified cervical region: Secondary | ICD-10-CM | POA: Diagnosis not present

## 2021-12-01 DIAGNOSIS — M4802 Spinal stenosis, cervical region: Secondary | ICD-10-CM | POA: Diagnosis not present

## 2021-12-01 DIAGNOSIS — G43109 Migraine with aura, not intractable, without status migrainosus: Secondary | ICD-10-CM

## 2021-12-01 DIAGNOSIS — G8929 Other chronic pain: Secondary | ICD-10-CM

## 2021-12-01 DIAGNOSIS — Z9889 Other specified postprocedural states: Secondary | ICD-10-CM

## 2021-12-01 DIAGNOSIS — G4486 Cervicogenic headache: Secondary | ICD-10-CM

## 2021-12-01 MED ORDER — PREDNISONE 20 MG PO TABS: ORAL_TABLET | ORAL | 0 refills | Status: DC

## 2021-12-01 MED ORDER — CYCLOBENZAPRINE HCL 10 MG PO TABS: 10.0000 mg | ORAL_TABLET | Freq: Three times a day (TID) | ORAL | 5 refills | Status: DC

## 2021-12-01 NOTE — Patient Instructions (Addendum)
Thank you for coming to the office today.  FMLA  For office visits You are allowed 3 hours for a doctors visit or treatment visit - 3 times per 1 week  For episodic flare / incapacity (symptoms preventing you from working) You are allowed 3 days per episode, up to 2 episodes per 1 week.  Keep in mind, since max is 3 days per episode, if you took 4+ days in a row, only 3 of those days are counted as FMLA. You would need to return prior to the next episode.   Start Prednisone taper 7 days for acute symptoms  Go ahead and re-schedule w Dr Melrose Nakayama for further assessment.  Re ordered Flexeril 90 pill count for 30 days with refills. Let me know if any issues.  Cramping - Try spoonful of yellow mustard to relieve leg cramps or try daily to prevent the problem  - OTC natural option is Hyland's Leg Cramps (Dissolving tablet) take as needed for muscle cramps   Please schedule a Follow-up Appointment to: Return in about 6 weeks (around 01/12/2022) for 6 week follow-up FMLA Back/Neck Pain.  If you have any other questions or concerns, please feel free to call the office or send a message through [place]. You may also schedule an earlier appointment if necessary.  Additionally, you may be receiving a survey about your experience at our office within a few days to 1 week by e-mail or mail. We value your feedback.  Nobie Putnam, DO [place]
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
## 2022-01-12 ENCOUNTER — Ambulatory Visit: Payer: BC Managed Care – PPO | Admitting: Family Medicine

## 2022-01-12 ENCOUNTER — Encounter: Payer: Self-pay | Admitting: Family Medicine

## 2022-01-12 VITALS — BP 128/80 | HR 96 | Ht <= 58 in | Wt 196.0 lb

## 2022-01-12 DIAGNOSIS — M4802 Spinal stenosis, cervical region: Secondary | ICD-10-CM | POA: Diagnosis not present

## 2022-01-12 DIAGNOSIS — M5136 Other intervertebral disc degeneration, lumbar region: Secondary | ICD-10-CM

## 2022-01-12 DIAGNOSIS — G43909 Migraine, unspecified, not intractable, without status migrainosus: Secondary | ICD-10-CM

## 2022-01-12 DIAGNOSIS — M5030 Other cervical disc degeneration, unspecified cervical region: Secondary | ICD-10-CM | POA: Diagnosis not present

## 2022-01-12 DIAGNOSIS — M797 Fibromyalgia: Secondary | ICD-10-CM

## 2022-01-12 DIAGNOSIS — R519 Headache, unspecified: Secondary | ICD-10-CM

## 2022-01-12 MED ORDER — UBRELVY 100 MG PO TABS: 100.0000 mg | ORAL_TABLET | ORAL | 2 refills | Status: AC | PRN
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
## 2022-01-12 NOTE — Patient Instructions (Addendum)
Thank you for coming to the office today.  Stop Nurtec ODT  Start Ubrelvy 163m take at first sign of severe headache / migraine. If not resolved within 2 hours, may repeat a dose. That is max dose for 24 hours. 16 pill count should be per month or 25 days  Recommend following with Dr PMelrose Nakayamaas well.   Please schedule a Follow-up Appointment to: Return in about 3 months (around 04/14/2022) for 3 month follow-up migraines, back and FMLA renew.  If you have any other questions or concerns, please feel free to call the office or send a message through [place] You may also schedule an earlier appointment if necessary.  Additionally, you may be receiving a survey about your experience at our office within a few days to 1 week by e-mail or mail. We value your feedback.  ANobie Putnam DO [place]

## 2022-01-15 IMAGING — CR DG CHEST 2V
1 series · 2 of 2 positions shown · non-contrast
Comparison: CT Abdomen and Pelvis 05/04/2020.

CLINICAL DATA: 50-year-old female with palpitations. Nausea and
dizziness. Smoker.

EXAM:
CHEST - 2 VIEW

[Series 1: dg chest 2 view · 0.14mm/px · 2 of 2 slices shown]
[im 1/2]
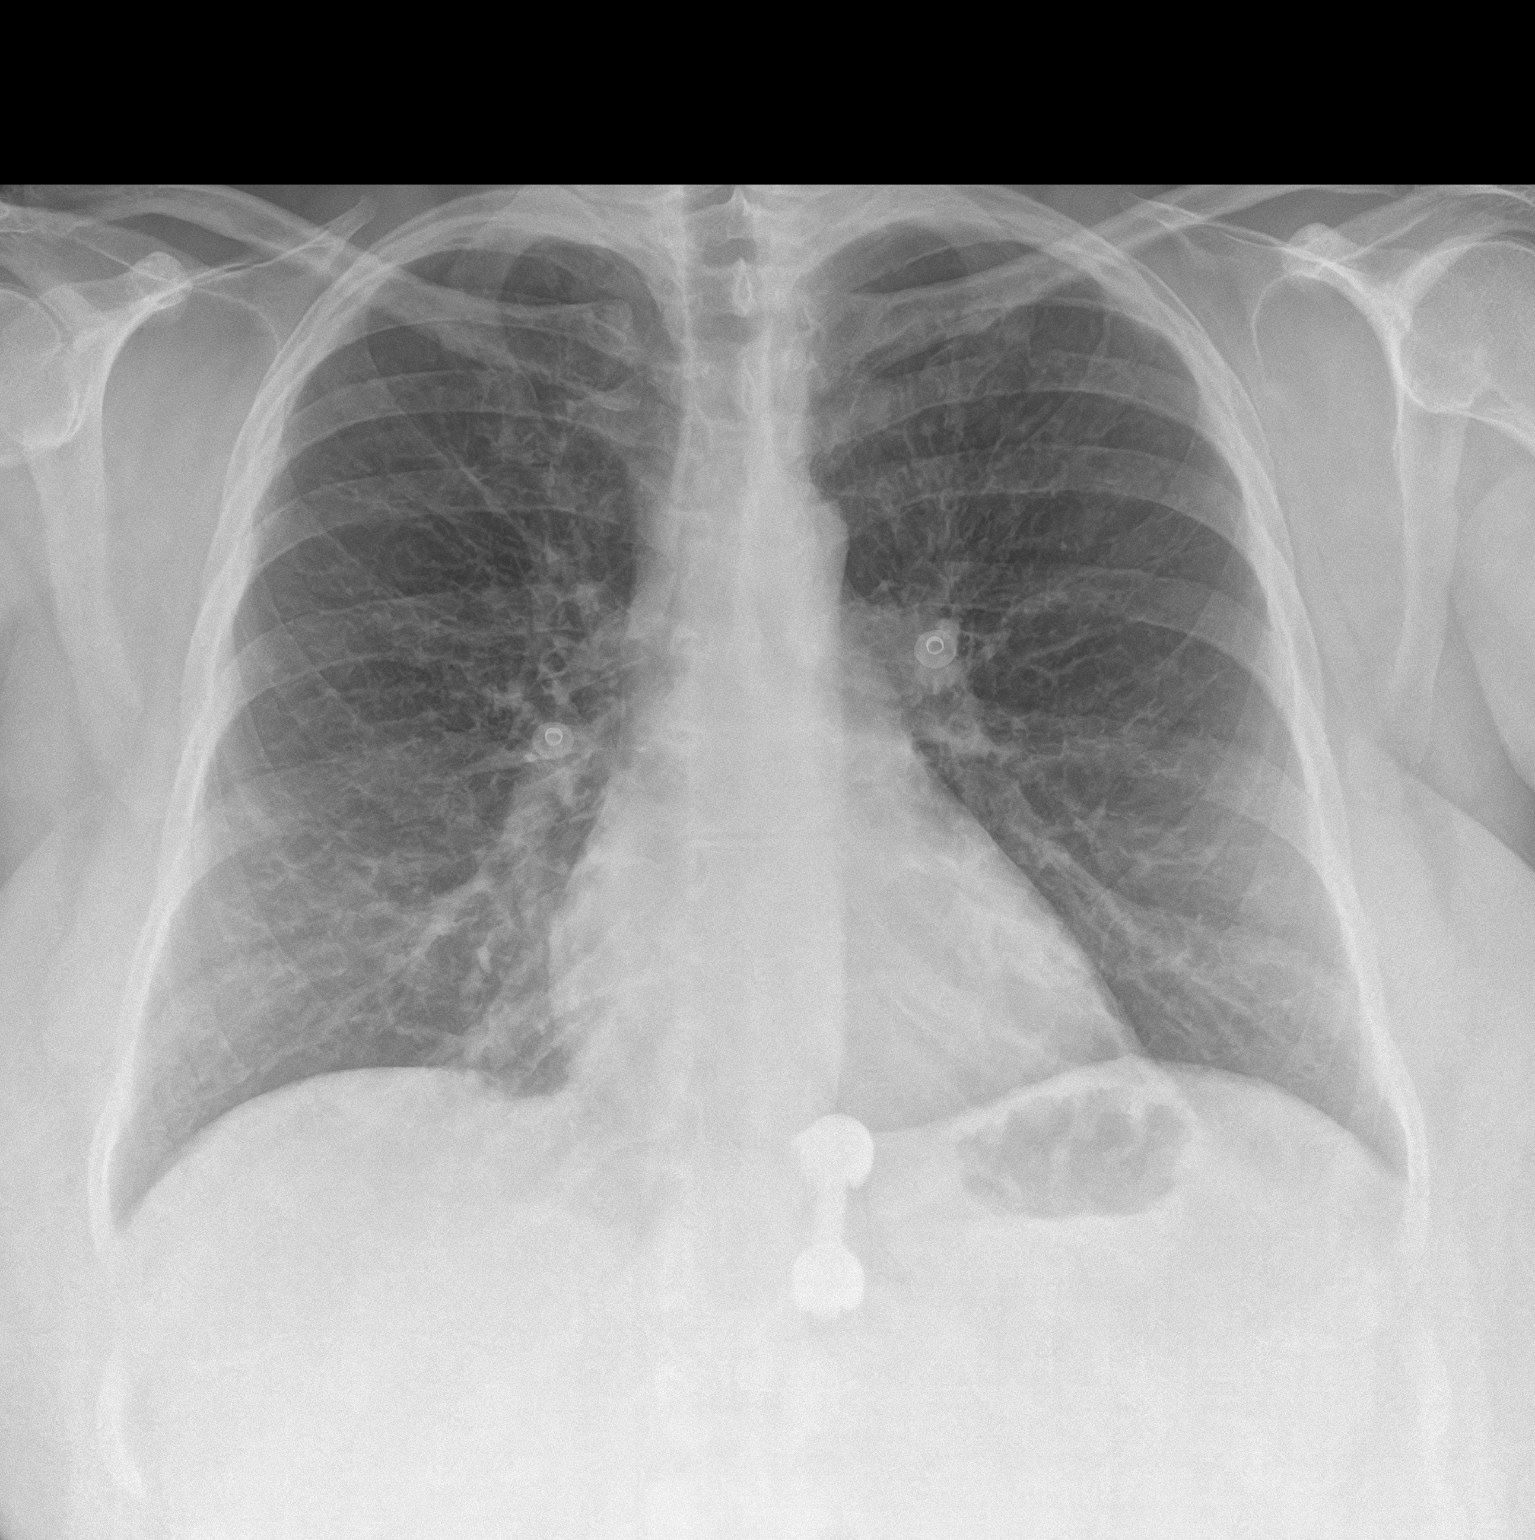
[im 2/2]
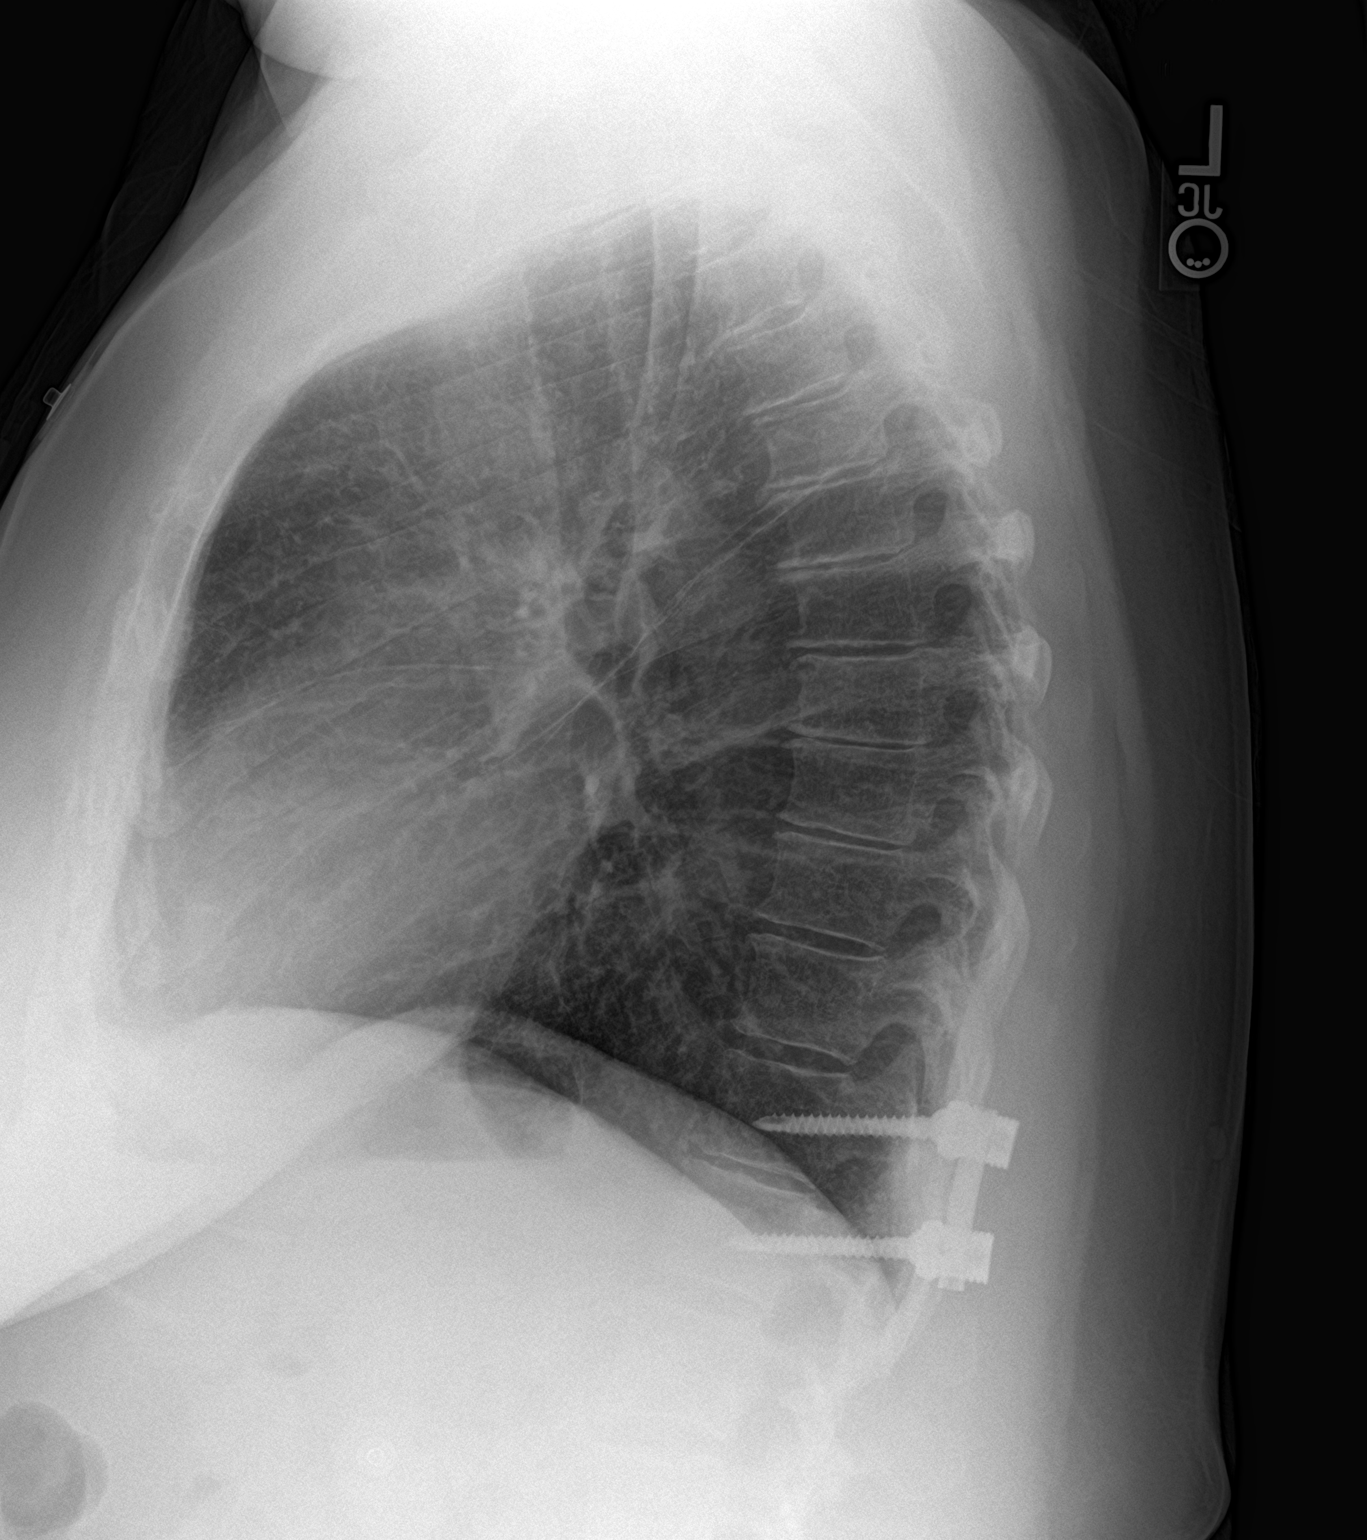

[2 of 2 positions shown; findings below may reference images not displayed]

FINDINGS: Normal cardiac size and mediastinal contours. Normal lung volumes.
Mild diffuse increased pulmonary interstitial markings, with subtle
attenuation of bronchovascular markings in the upper lobes. No
pneumothorax, pleural effusion, pulmonary edema or confluent
pulmonary opacity. Redemonstrated unilateral left lower thoracic
pedicle screw fixation. No acute osseous abnormality identified.
Negative visible bowel gas pattern.
IMPRESSION: Probable smoking related mild diffuse interstitial prominence. No
acute cardiopulmonary abnormality.

## 2022-01-23 DIAGNOSIS — G43009 Migraine without aura, not intractable, without status migrainosus: Secondary | ICD-10-CM | POA: Diagnosis not present

## 2022-01-23 DIAGNOSIS — M542 Cervicalgia: Secondary | ICD-10-CM | POA: Diagnosis not present

## 2022-01-23 DIAGNOSIS — R200 Anesthesia of skin: Secondary | ICD-10-CM | POA: Diagnosis not present

## 2022-01-23 DIAGNOSIS — M545 Low back pain, unspecified: Secondary | ICD-10-CM | POA: Diagnosis not present

## 2022-01-24 DIAGNOSIS — R200 Anesthesia of skin: Secondary | ICD-10-CM | POA: Diagnosis not present

## 2022-01-26 ENCOUNTER — Other Ambulatory Visit: Payer: Self-pay | Admitting: Physician Assistant

## 2022-01-26 DIAGNOSIS — R200 Anesthesia of skin: Secondary | ICD-10-CM

## 2022-01-26 DIAGNOSIS — N393 Stress incontinence (female) (male): Secondary | ICD-10-CM

## 2022-01-26 DIAGNOSIS — M542 Cervicalgia: Secondary | ICD-10-CM

## 2022-02-03 ENCOUNTER — Ambulatory Visit
Admission: RE | Admit: 2022-02-03 | Discharge: 2022-02-03 | Disposition: A | Discharge status: Home / Self Care | Payer: BC Managed Care – PPO | Source: Ambulatory Visit | Attending: Physician Assistant | Admitting: Physician Assistant

## 2022-02-03 ENCOUNTER — Other Ambulatory Visit: Payer: Self-pay | Admitting: Family Medicine

## 2022-02-03 DIAGNOSIS — R200 Anesthesia of skin: Secondary | ICD-10-CM

## 2022-02-03 DIAGNOSIS — R202 Paresthesia of skin: Secondary | ICD-10-CM | POA: Diagnosis not present

## 2022-02-03 DIAGNOSIS — N393 Stress incontinence (female) (male): Secondary | ICD-10-CM | POA: Diagnosis not present

## 2022-02-03 DIAGNOSIS — M50223 Other cervical disc displacement at C6-C7 level: Secondary | ICD-10-CM | POA: Diagnosis not present

## 2022-02-03 DIAGNOSIS — M542 Cervicalgia: Secondary | ICD-10-CM | POA: Diagnosis not present

## 2022-02-03 DIAGNOSIS — M50222 Other cervical disc displacement at C5-C6 level: Secondary | ICD-10-CM | POA: Diagnosis not present

## 2022-02-03 DIAGNOSIS — G43909 Migraine, unspecified, not intractable, without status migrainosus: Secondary | ICD-10-CM

## 2022-02-03 DIAGNOSIS — M4602 Spinal enthesopathy, cervical region: Secondary | ICD-10-CM | POA: Diagnosis not present

## 2022-02-03 DIAGNOSIS — M4802 Spinal stenosis, cervical region: Secondary | ICD-10-CM | POA: Diagnosis not present

## 2022-02-03 NOTE — Telephone Encounter (Signed)
Requested medication (s) are due for refill today - no  Requested medication (s) are on the active medication list -yes  Future visit scheduled -no  Last refill: 01/12/22 #16 2RF  Notes to clinic: medication not assigned protocol- provider review   Requested Prescriptions  Pending Prescriptions Disp Refills   UBRELVY 100 MG TABS [Pharmacy Med Name: UBRELVY 100MG TABLETS] 16 tablet 2    Sig: TAKE 1 TABLET BY MOUTH AS NEEDED FOR MIGRAINE. MAY REPEAT IN 2 HRS IF NOT RESOLVED. MAX 2 TABLET IN 24HRS     Off-Protocol Failed - 02/03/2022  3:19 AM      Failed - Medication not assigned to a protocol, review manually.      Passed - Valid encounter within last 12 months    Recent Outpatient Visits           3 weeks ago Episodic migraine   [place], DO   2 months ago Chronic bilateral low back pain with bilateral sciatica   [place], DO   4 months ago Morbid obesity [facility])   [place], DO   7 months ago Major depressive disorder, recurrent, moderate ([place])   [facility] Olin Hauser, DO   10 months ago Annual physical exam   [place], DO       Future Appointments             In 1 month [place] Ranger, Devonne Doughty, [facility], [place]   In 2 months Parks Ranger, Devonne Doughty, DO [facility], [facility]               Requested Prescriptions  Pending Prescriptions Disp Refills   UBRELVY 100 MG TABS [Pharmacy Med Name: UBRELVY 100MG TABLETS] 16 tablet 2    Sig: TAKE 1 TABLET BY MOUTH AS NEEDED FOR MIGRAINE. MAY REPEAT IN 2 HRS IF NOT RESOLVED. MAX 2 TABLET IN 24HRS     Off-Protocol Failed - 02/03/2022  3:19 AM      Failed - Medication not assigned to a protocol, review manually.      Passed - Valid encounter within last 12 months    Recent Outpatient Visits            3 weeks ago Episodic migraine   [place], DO   2 months ago Chronic bilateral low back pain with bilateral sciatica   [place], DO   4 months ago Morbid obesity [facility])   [place], DO   7 months ago Major depressive disorder, recurrent, moderate ([place])   [facility] Olin Hauser, DO   10 months ago Annual physical exam   [facility] Olin Hauser, DO       Future Appointments             In 1 month Parks Ranger, Devonne Doughty, DO [facility], [place]   In 2 months Parks Ranger, Devonne Doughty, [facility], [facility]

## 2022-02-10 ENCOUNTER — Telehealth: Payer: Self-pay | Admitting: Emergency Medicine

## 2022-02-10 ENCOUNTER — Ambulatory Visit
Admission: EM | Admit: 2022-02-10 | Discharge: 2022-02-10 | Disposition: A | Discharge status: Home / Self Care | Payer: BC Managed Care – PPO | Attending: Emergency Medicine | Admitting: Emergency Medicine

## 2022-02-10 DIAGNOSIS — J4521 Mild intermittent asthma with (acute) exacerbation: Secondary | ICD-10-CM | POA: Insufficient documentation

## 2022-02-10 DIAGNOSIS — B349 Viral infection, unspecified: Secondary | ICD-10-CM | POA: Insufficient documentation

## 2022-02-10 DIAGNOSIS — Z1152 Encounter for screening for COVID-19: Secondary | ICD-10-CM | POA: Insufficient documentation

## 2022-02-10 DIAGNOSIS — R52 Pain, unspecified: Secondary | ICD-10-CM | POA: Diagnosis not present

## 2022-02-10 DIAGNOSIS — Z7952 Long term (current) use of systemic steroids: Secondary | ICD-10-CM | POA: Insufficient documentation

## 2022-02-10 DIAGNOSIS — J101 Influenza due to other identified influenza virus with other respiratory manifestations: Secondary | ICD-10-CM | POA: Diagnosis not present

## 2022-02-10 LAB — RESP PANEL BY RT-PCR (FLU A&B, COVID) ARPGX2
Influenza A by PCR: POSITIVE — AB
Influenza B by PCR: NEGATIVE
SARS Coronavirus 2 by RT PCR: NEGATIVE

## 2022-02-10 MED ORDER — PREDNISONE 10 MG PO TABS: 40.0000 mg | ORAL_TABLET | Freq: Every day | ORAL | 0 refills | Status: AC

## 2022-02-10 NOTE — Telephone Encounter (Signed)
Telephone call to patient to discuss positive Influenza A.  No answer.  Left message to call back.

## 2022-02-10 NOTE — Discharge Instructions (Addendum)
Take the prednisone as directed.  Continue to use your albuterol inhaler as directed.    Your COVID and Flu tests are pending.    Take Tylenol or ibuprofen as needed for fever or discomfort.  Rest and keep yourself hydrated.    Follow-up with your primary care provider if your symptoms are not improving.

## 2022-02-10 NOTE — ED Triage Notes (Signed)
Patient to Urgent Care with complaints of head congestion, productive cough, fevers, ear aches and generalized body aches x3 days.   Unsure max temp. Patient reports she has been resting in bed since Tuesday.  Has been taking nyquil.

## 2022-02-10 NOTE — ED Provider Notes (Signed)
Roderic Palau    CSN: [id number] Arrival date & time: 02/10/22  2330      History   Chief Complaint Chief Complaint  Patient presents with   Nasal Congestion   Fever   Generalized Body Aches    HPI Megan Hawkins is a 51 y.o. female.  Patient presents with 3 day history of subjective fever, body aches, ear pain, congestion, cough, wheezing, shortness of breath.  Treatment at home with albuterol inhaler and Nyquil; no OTC medications taken today.  No sore throat, vomiting, diarrhea, or other symptoms.  Her medical history includes asthma, migraine headaches, prediabetes, fibromyalgia, DDD, morbid obesity, current everyday smoker.    The history is provided by the patient and medical records.    Past Medical History:  Diagnosis Date   Allergy    Anxiety    Asthma    BRCA negative 01/2021   MyRisk neg except AXIN2 VUS; IBIS=7.1%/riskscore=12.9%   Colitis    Diverticulitis    Family history of ovarian cancer    Frequent headaches     Patient Active Problem List   Diagnosis Date Noted   Body aches 02/10/2022   Pure hypercholesterolemia 09/21/2021   Epidermoid cyst of skin of scalp 07/14/2021   Major depressive disorder, recurrent, moderate ([place]) 03/17/2021   Pre-diabetes 03/17/2021   Morbid obesity ([place]) 03/17/2021   Fibromyalgia 03/17/2021   Headache disorder 01/06/2021   Episodic migraine 12/01/2020   DDD (degenerative disc disease), cervical 12/01/2020   Cervical spinal stenosis 12/01/2020   DDD (degenerative disc disease), lumbar 12/01/2020   Hx of decompressive lumbar laminectomy 04/17/2018   Persons encountering health services in other specified circumstances 04/05/2011   Nicotine dependence, unspecified, uncomplicated 07/62/2633    Past Surgical History:  Procedure Laterality Date   BACK SURGERY  11/2010   T11-T12, hardware placement   CESAREAN SECTION     COLONOSCOPY WITH PROPOFOL N/A 07/07/2021   Procedure: COLONOSCOPY WITH PROPOFOL;  Surgeon:  Jonathon Bellows, MD;  Location: [facility] ENDOSCOPY;  Service: Gastroenterology;  Laterality: N/A;   LAMINECTOMY AND MICRODISCECTOMY THORACIC SPINE  10/2007   T11-T12   ULNAR NERVE REPAIR  2012    OB History     Gravida  6   Para  4   Term  3   Preterm  1   AB  1   Living  5      SAB  1   IAB      Ectopic      Multiple  1   Live Births               Home Medications    Prior to Admission medications   Medication Sig Start Date End Date Taking? Authorizing Provider  albuterol (VENTOLIN HFA) 108 (90 Base) MCG/ACT inhaler Inhale 1-2 puffs into the lungs every 6 (six) hours as needed for wheezing or shortness of breath. 01/12/21   Karamalegos, Alexander J, DO  CONTRAVE 8-90 MG TB12 Week 1: Take 1 tab daily with breakfast. Week 2: Take 1 tab twice daily with meal. Week 3: Take 2 tab with AM meal and 1 tab with PM meal. Week 4+: Take 2 tabs twice daily with meals - continue for weight loss 09/21/21   Karamalegos, Devonne Doughty, DO  cyclobenzaprine (FLEXERIL) 10 MG tablet Take 1 tablet (10 mg total) by mouth 3 (three) times daily. 12/01/21   Karamalegos, Devonne Doughty, DO  DULoxetine (CYMBALTA) 30 MG capsule TAKE 1 CAPSULE(30 MG) BY MOUTH DAILY 11/01/21  Karamalegos, Alexander J, DO  gabapentin (NEURONTIN) 400 MG capsule Take 400 mg by mouth 3 (three) times daily. 10/29/21   [provider]  ibuprofen (ADVIL) 800 MG tablet Take 1 tablet (800 mg total) by mouth every 8 (eight) hours as needed for mild pain. 05/04/20   Ward, Delice Bison, DO  nortriptyline (PAMELOR) 10 MG capsule Take 20 mg by mouth at bedtime. 01/06/21   [provider]  UBRELVY 100 MG TABS Take 100 mg by mouth as needed (migraine headache). May repeat 1 dose within 2 hours if headache not resolved or returns. Max dose in 24 hours is 2 tablets. 01/12/22   Olin Hauser, DO    Family History Family History  Problem Relation Age of Onset   Heart failure Father    Ovarian cancer Paternal Aunt     Ovarian cancer Paternal Aunt     Social History Social History   Tobacco Use   Smoking status: Every Day    Packs/day: 0.50    Types: Cigarettes    Passive exposure: Past   Smokeless tobacco: Never  Vaping Use   Vaping Use: Never used  Substance Use Topics   Alcohol use: Not Currently   Drug use: Never     Allergies   Sulfa antibiotics   Review of Systems Review of Systems  Constitutional:  Positive for chills and fever.  HENT:  Positive for congestion and ear pain. Negative for sore throat.   Respiratory:  Positive for cough, shortness of breath and wheezing.   Cardiovascular:  Negative for chest pain and palpitations.  Gastrointestinal:  Negative for diarrhea and vomiting.  Skin:  Negative for color change and rash.  All other systems reviewed and are negative.    Physical Exam Triage Vital Signs ED Triage Vitals  Enc Vitals Group     BP 02/10/22 0904 116/82     Pulse Rate 02/10/22 0853 (!) 121     Resp 02/10/22 0853 18     Temp 02/10/22 0853 98.4 F (36.9 C)     Temp src --      SpO2 02/10/22 0853 95 %     Weight 02/10/22 0903 197 lb (89.4 kg)     Height 02/10/22 0903 _0  (1.473 m)     Head Circumference --      Peak Flow --      Pain Score 02/10/22 0903 4     Pain Loc --      Pain Edu? --      Excl. in [place]? --    No data found.  Updated Vital Signs BP 116/82   Pulse (!) 119   Temp 98.4 F (36.9 C)   Resp 18   Ht _1  (1.473 m)   Wt 197 lb (89.4 kg)   SpO2 95%   BMI 41.17 kg/m   Visual Acuity Right Eye Distance:   Left Eye Distance:   Bilateral Distance:    Right Eye Near:   Left Eye Near:    Bilateral Near:     Physical Exam Vitals and nursing note reviewed.  Constitutional:      General: She is not in acute distress.    Appearance: She is well-developed. She is obese. She is not ill-appearing.  HENT:     Right Ear: Tympanic membrane normal.     Left Ear: Tympanic membrane normal.     Nose: Nose normal.     Mouth/Throat:      Mouth: Mucous membranes are  moist.     Pharynx: Oropharynx is clear.  Eyes:     Conjunctiva/sclera: Conjunctivae normal.  Cardiovascular:     Rate and Rhythm: Regular rhythm. Tachycardia present.     Heart sounds: Normal heart sounds.  Pulmonary:     Effort: Pulmonary effort is normal. No respiratory distress.     Breath sounds: Normal breath sounds. No wheezing, rhonchi or rales.  Musculoskeletal:     Cervical back: Neck supple.  Skin:    General: Skin is warm and dry.  Neurological:     Mental Status: She is alert.  Psychiatric:        Mood and Affect: Mood normal.        Behavior: Behavior normal.      UC Treatments / Results  Labs (all labs ordered are listed, but only abnormal results are displayed) Labs Reviewed  RESP PANEL BY RT-PCR (FLU A&B, COVID) [id number]    EKG   Radiology No results found.  Procedures Procedures (including critical care time)  Medications Ordered in UC Medications - No data to display  Initial Impression / Assessment and Plan / UC Course  I have reviewed the triage vital signs and the nursing notes.  Pertinent labs & imaging results that were available during my care of the patient were reviewed by me and considered in my medical decision making (see chart for details).   Viral illness, asthma exacerbation.  Treating with prednisone and continued use of albuterol.  COVID and Flu pending.  Patient is COVID-positive, would recommend treatment with molnupiravir.  No recent lab work in chart.  If she is flu positive, she is outside the window for treatment.  Discussed symptomatic treatment including Tylenol or ibuprofen, rest, hydration.  Instructed patient to follow up with PCP if symptoms are not improving.  She agrees to plan of care.   Final Clinical Impressions(s) / UC Diagnoses   Final diagnoses:  Body aches  Viral illness  Mild intermittent asthma with acute exacerbation     Discharge Instructions      Take the  prednisone as directed.  Continue to use your albuterol inhaler as directed.    Your COVID and Flu tests are pending.    Take Tylenol or ibuprofen as needed for fever or discomfort.  Rest and keep yourself hydrated.    Follow-up with your primary care provider if your symptoms are not improving.         ED Prescriptions   None    PDMP not reviewed this encounter.   Sharion Balloon, NP 02/10/22 [phone]

## 2022-02-15 ENCOUNTER — Encounter: Payer: Self-pay | Admitting: Internal Medicine

## 2022-02-15 ENCOUNTER — Ambulatory Visit: Payer: Self-pay

## 2022-02-15 ENCOUNTER — Ambulatory Visit: Payer: BC Managed Care – PPO | Admitting: Internal Medicine

## 2022-02-15 VITALS — BP 128/76 | HR 96 | Temp 96.5°F | Wt 197.0 lb

## 2022-02-15 DIAGNOSIS — J101 Influenza due to other identified influenza virus with other respiratory manifestations: Secondary | ICD-10-CM | POA: Diagnosis not present

## 2022-02-15 MED ORDER — PROMETHAZINE-DM 6.25-15 MG/5ML PO SYRP: 5.0000 mL | ORAL_SOLUTION | Freq: Four times a day (QID) | ORAL | 0 refills | Status: DC | PRN

## 2022-02-15 NOTE — Patient Instructions (Signed)

## 2022-02-15 NOTE — Progress Notes (Signed)
Subjective:    Patient ID: Megan Hawkins, female    DOB: 1970/05/11, 51 y.o.   MRN: [id number]  HPI  Pt presents to the clinic today with c/o headache, runny nose, ear pain, cough, wheezing, shortness of breath, and diarrhea. This started 1 week ago. The headache is located in her temples. She is blowing clear mucous out of her nose. She has not noticed any drainage from the ears. The cough is productive. She denies sore throat, chest pain, nausea or vomiting. She has tried Albuterol, Nyquil and cough drops OTC with minimal relief of symptoms. She was seen 12/15 at urgent care for the same, diagnosed with Influenza A. She was prescribed Prednisone which she just picked today.  She is a smoker but has not been able to smoke in the last few days.  Review of Systems     Past Medical History:  Diagnosis Date   Allergy    Anxiety    Asthma    BRCA negative 01/2021   MyRisk neg except AXIN2 VUS; IBIS=7.1%/riskscore=12.9%   Colitis    Diverticulitis    Family history of ovarian cancer    Frequent headaches     Current Outpatient Medications  Medication Sig Dispense Refill   albuterol (VENTOLIN HFA) 108 (90 Base) MCG/ACT inhaler Inhale 1-2 puffs into the lungs every 6 (six) hours as needed for wheezing or shortness of breath. 8 g 3   CONTRAVE 8-90 MG TB12 Week 1: Take 1 tab daily with breakfast. Week 2: Take 1 tab twice daily with meal. Week 3: Take 2 tab with AM meal and 1 tab with PM meal. Week 4+: Take 2 tabs twice daily with meals - continue for weight loss 120 tablet 0   cyclobenzaprine (FLEXERIL) 10 MG tablet Take 1 tablet (10 mg total) by mouth 3 (three) times daily. 90 tablet 5   DULoxetine (CYMBALTA) 30 MG capsule TAKE 1 CAPSULE(30 MG) BY MOUTH DAILY 90 capsule 0   gabapentin (NEURONTIN) 400 MG capsule Take 400 mg by mouth 3 (three) times daily.     ibuprofen (ADVIL) 800 MG tablet Take 1 tablet (800 mg total) by mouth every 8 (eight) hours as needed for mild pain. 30 tablet 0    nortriptyline (PAMELOR) 10 MG capsule Take 20 mg by mouth at bedtime.     predniSONE (DELTASONE) 10 MG tablet Take 4 tablets (40 mg total) by mouth daily for 5 days. 20 tablet 0   UBRELVY 100 MG TABS Take 100 mg by mouth as needed (migraine headache). May repeat 1 dose within 2 hours if headache not resolved or returns. Max dose in 24 hours is 2 tablets. 16 tablet 2   No current facility-administered medications for this visit.    Allergies  Allergen Reactions   Sulfa Antibiotics Other (See Comments)    Syncope     Family History  Problem Relation Age of Onset   Heart failure Father    Ovarian cancer Paternal Aunt    Ovarian cancer Paternal Aunt     Social History   Socioeconomic History   Marital status: Married    Spouse name: Not on file   Number of children: Not on file   Years of education: Not on file   Highest education level: Not on file  Occupational History   Not on file  Tobacco Use   Smoking status: Every Day    Packs/day: 0.50    Types: Cigarettes    Passive exposure: Past   Smokeless  tobacco: Never  Vaping Use   Vaping Use: Never used  Substance and Sexual Activity   Alcohol use: Not Currently   Drug use: Never   Sexual activity: Yes  Other Topics Concern   Not on file  Social History Narrative   Not on file   Social Determinants of Health   Financial Resource Strain: Not on file  Food Insecurity: Not on file  Transportation Needs: Not on file  Physical Activity: Not on file  Stress: Not on file  Social Connections: Not on file  Intimate Partner Violence: Not on file     Constitutional: Patient reports fever, chills, body aches and headache.  Denies or abrupt weight changes.  HEENT: Patient reports runny nose, ear pain.  Denies eye pain, eye redness, ear pain, ringing in the ears, wax buildup, runny nose, nasal congestion, bloody nose, or sore throat. Respiratory: Pt reports cough and shortness of breath. Denies difficulty breathing.    Cardiovascular: Denies chest pain, chest tightness, palpitations or swelling in the hands or feet.  Gastrointestinal: Patient reports diarrhea.  Denies abdominal pain, bloating, constipation, or blood in the stool.   No other specific complaints in a complete review of systems (except as listed in HPI above).  Objective:   Physical Exam   BP 128/76 (BP Location: Right Arm, Patient Position: Sitting, Cuff Size: Normal)   Pulse 96   Temp (!) 96.5 F (35.8 C) (Temporal)   Wt 197 lb (89.4 kg)   SpO2 97%   BMI 41.17 kg/m   Wt Readings from Last 3 Encounters:  02/10/22 197 lb (89.4 kg)  01/12/22 196 lb (88.9 kg)  12/01/21 194 lb 9.6 oz (88.3 kg)    General: Appears her stated age, obese, appears unwell but in NAD. Skin: Warm, dry and intact. No rashes noted. HEENT: Head: normal shape and size; Eyes: sclera white, no icterus, conjunctiva pink, PERRLA and EOMs intact; Ears: Tm's gray and intact, normal light reflex;  Throat/Mouth: Teeth present, mucosa erythematous and moist, no exudate, lesions or ulcerations noted.  Neck: No adenopathy noted. Cardiovascular: Normal rate and rhythm. S1,S2 noted.  No murmur, rubs or gallops noted. No JVD or BLE edema. No carotid bruits noted. Pulmonary/Chest: Increased effort and positive vesicular breath sounds. No respiratory distress. No wheezes, rales or ronchi noted.  Musculoskeletal: No difficulty with gait.  Neurological: Alert and oriented.     BMET    Component Value Date/Time   NA 139 03/10/2021 0807   K 4.2 03/10/2021 0807   CL 106 03/10/2021 0807   CO2 28 03/10/2021 0807   GLUCOSE 113 (H) 03/10/2021 0807   BUN 12 03/10/2021 0807   CREATININE 0.85 03/10/2021 0807   CALCIUM 8.8 03/10/2021 0807   GFRNONAA >60 11/09/2020 2046    Lipid Panel     Component Value Date/Time   CHOL 190 03/10/2021 0807   TRIG 92 03/10/2021 0807   HDL 41 (L) 03/10/2021 0807   CHOLHDL 4.6 03/10/2021 0807   LDLCALC 129 (H) 03/10/2021 0807    CBC     Component Value Date/Time   WBC 11.9 (H) 03/10/2021 0807   RBC 5.08 03/10/2021 0807   HGB 15.3 03/10/2021 0807   HCT 44.9 03/10/2021 0807   PLT 242 03/10/2021 0807   MCV 88.4 03/10/2021 0807   MCH 30.1 03/10/2021 0807   MCHC 34.1 03/10/2021 0807   RDW 12.8 03/10/2021 0807   LYMPHSABS 2,463 03/10/2021 0807   EOSABS 417 03/10/2021 0807   BASOSABS 71 03/10/2021 0807  Hgb A1C Lab Results  Component Value Date   HGBA1C 5.8 (H) 03/10/2021           Assessment & Plan:  Influenza A:  She is outside the range for antiviral therapy She just picked up the prednisone today, advised her we need to give this more time Continue Albuterol as needed No indication for antibiotics as she clinically does not have a pneumonia and influenza is a viral syndrome Rx for Promethazine DM cough syrup ER precautions discussed given her work of breathing however her sats are 97% and she is not tachycardic and her lung exam is normal Work note provided  Follow up with your PCP as previously scheduled Webb Silversmith, NP

## 2022-02-15 NOTE — Telephone Encounter (Signed)
  Chief Complaint: SOB, wheezing (able to talk in full sentences Symptoms: severe coughing spells, chills, body ache, subjective fever, diarrhea, headache, sore throat Frequency: 02/07/22 Pertinent Negatives: Patient denies chest pain Disposition: [] ED /[] Urgent Care (no appt availability in office) / [x] Appointment(In office/virtual)/ []  [facility]/ [] Home Care/ [] Refused Recommended Disposition /[] [facility]/ []  Follow-up with PCP Additional Notes:  Reason for Disposition  Earache  Answer Assessment - Initial Assessment Questions 1. WORST SYMPTOM: "What is your worst symptom?" (e.g., cough, runny nose, muscle aches, headache, sore throat, fever)      SOB, wheezing 2. ONSET: "When did your flu symptoms start?"      02/07/22 3. COUGH: "How bad is the cough?"       Frequent and severe coughing spells 4. RESPIRATORY DISTRESS: "Describe your breathing."      SOB when talking , wheezing 5. FEVER: "Do you have a fever?" If Yes, ask: "What is your temperature, how was it measured, and when did it start?"     Yes- subjective- chills body aches 6. EXPOSURE: "Were you exposed to someone with influenza?"       yes 7. FLU VACCINE: "Did you get a flu shot this year?"     yes 8. HIGH RISK DISEASE: "Do you have any chronic medical problems?" (e.g., heart or lung disease, asthma, weak immune system, or other HIGH RISK conditions)     Asthma (allergy related) 9. PREGNANCY: "Is there any chance you are pregnant?" "When was your last menstrual period?"     N/a 10. OTHER SYMPTOMS: "Do you have any other symptoms?"  (e.g., runny nose, muscle aches, headache, sore throat)       Muscle aches, productive cough (unsure of color), diarrhea, headache, wheezing, earache  Protocols used: Influenza (Flu) - [facility]

## 2022-02-27 DIAGNOSIS — F439 Reaction to severe stress, unspecified: Secondary | ICD-10-CM

## 2022-02-27 DIAGNOSIS — Z9189 Other specified personal risk factors, not elsewhere classified: Secondary | ICD-10-CM

## 2022-02-27 HISTORY — DX: Other specified personal risk factors, not elsewhere classified: Z91.89

## 2022-02-27 HISTORY — DX: Reaction to severe stress, unspecified: F43.9

## 2022-03-06 ENCOUNTER — Encounter: Payer: Self-pay | Admitting: Family Medicine

## 2022-03-06 ENCOUNTER — Ambulatory Visit: Payer: BC Managed Care – PPO | Admitting: Family Medicine

## 2022-03-06 VITALS — BP 128/88 | HR 96 | Ht <= 58 in | Wt 195.8 lb

## 2022-03-06 DIAGNOSIS — J101 Influenza due to other identified influenza virus with other respiratory manifestations: Secondary | ICD-10-CM

## 2022-03-06 NOTE — Progress Notes (Signed)
Subjective:    Patient ID: Megan Hawkins, female    DOB: 1970-09-08, 52 y.o.   MRN: [id number]  Megan Hawkins is a 52 y.o. female presenting on 03/06/2022 for paperwork and Follow-up   HPI  Influenza A Post Influenza Syndrome  Reports history of recent Influenza infection, symptoms started 02/07/22 caused her to miss work and she had worsening symptoms that brought her to the Urgent Care on 02/10/22. She was treated with Prednisone taper at that time after confirming positive Influenza A. - She was seen at our office on 02/15/22 by Webb Silversmith, [place] and still not able to return to work, she was given prescription anti nausea and symptom relief medication. Note written out 02/15/22 through 02/21/22, but she was still unable to return. - She applied for Short Term Disability through her job on 02/23/22 to cover the influenza episode and time missed.  Currently she has significantly improved. She still has a residual lingering cough that is mild.  She is no longer on OTC or Rx Medications for Influenza.  She is ready to return to work starting tomorrow.  Denies any fever, body aches, chills, nausea vomiting, abdominal pain, headache, dyspnea wheezing  Health Maintenance: S/p Flu Vaccine this season.     01/12/2022   10:05 AM 12/01/2021   10:33 AM 09/21/2021    9:25 AM  Depression screen PHQ 2/9  Decreased Interest '2 2 1  '$ Down, Depressed, Hopeless 2 1 0  PHQ - 2 Score '4 3 1  '$ Altered sleeping '3 1 1  '$ Tired, decreased energy '3 1 1  '$ Change in appetite 3 0 0  Feeling bad or failure about yourself  2 0 0  Trouble concentrating 3 1 0  Moving slowly or fidgety/restless 2  0  Suicidal thoughts 0 0 0  PHQ-9 Score '20 6 3  '$ Difficult doing work/chores Extremely dIfficult Very difficult Not difficult at all    Social History   Tobacco Use   Smoking status: Every Day    Packs/day: 0.50    Types: Cigarettes    Passive exposure: Past   Smokeless tobacco: Never  Vaping Use   Vaping  Use: Never used  Substance Use Topics   Alcohol use: Not Currently   Drug use: Never    Review of Systems Per HPI unless specifically indicated above     Objective:    BP 128/88   Pulse 96   Ht '4\' 10"'$  (1.473 m)   Wt 195 lb 12.8 oz (88.8 kg)   SpO2 96%   BMI 40.92 kg/m   Wt Readings from Last 3 Encounters:  03/06/22 195 lb 12.8 oz (88.8 kg)  02/15/22 197 lb (89.4 kg)  02/10/22 197 lb (89.4 kg)    Physical Exam Vitals and nursing note reviewed.  Constitutional:      General: She is not in acute distress.    Appearance: She is well-developed. She is not diaphoretic.     Comments: Well-appearing, comfortable, cooperative  HENT:     Head: Normocephalic and atraumatic.  Eyes:     General:        Right eye: No discharge.        Left eye: No discharge.     Conjunctiva/sclera: Conjunctivae normal.  Neck:     Thyroid: No thyromegaly.  Cardiovascular:     Rate and Rhythm: Normal rate and regular rhythm.     Heart sounds: Normal heart sounds. No murmur heard. Pulmonary:     Effort: Pulmonary effort is  normal. No respiratory distress.     Breath sounds: Normal breath sounds. No wheezing or rales.  Musculoskeletal:        General: Normal range of motion.     Cervical back: Normal range of motion and neck supple.  Lymphadenopathy:     Cervical: No cervical adenopathy.  Skin:    General: Skin is warm and dry.     Findings: No erythema or rash.  Neurological:     Mental Status: She is alert and oriented to person, place, and time.  Psychiatric:        Behavior: Behavior normal.     Comments: Well groomed, good eye contact, normal speech and thoughts      Results for orders placed or performed during the hospital encounter of 02/10/22  Resp Panel by RT-PCR (Flu A&B, Covid) Anterior Nasal Swab   Specimen: Anterior Nasal Swab  Result Value Ref Range   SARS Coronavirus 2 by RT PCR NEGATIVE NEGATIVE   Influenza A by PCR POSITIVE (A) NEGATIVE   Influenza B by PCR NEGATIVE  NEGATIVE      Assessment & Plan:   Problem List Items Addressed This Visit     RESOLVED: Influenza A - Primary    Resolved Influenza A Started 02/07/22 and leave of absence started UC 02/10/22 - tested / positive  Last visit to [facility] 02/15/22, note out of work as unable to return still - she continued on treatment plan until now here in follow up today demonstrates able to return.  Has since filled for Short Term Disability due to time missed with Influenza A illness.  She is now cleared to return to work tomorrow on 03/07/22 without restrictions Paperwork completed and returned to patient today       No orders of the defined types were placed in this encounter.     Follow up plan: Return if symptoms worsen or fail to improve.    Nobie Putnam, [facility] 03/06/2022, 3:34 PM

## 2022-03-06 NOTE — Assessment & Plan Note (Addendum)
Resolved Influenza A Started 02/07/22 and leave of absence started UC 02/10/22 - tested / positive  Last visit to [facility] 02/15/22, note out of work as unable to return still - she continued on treatment plan until now here in follow up today demonstrates able to return.  Has since filled for Short Term Disability due to time missed with Influenza A illness.  She is now cleared to return to work tomorrow on 03/07/22 without restrictions Paperwork completed and returned to patient today

## 2022-03-06 NOTE — Patient Instructions (Addendum)
Thank you for coming to the office today.  Cleared to return to work tomorrow Tuesday 03/07/22. After Influenza has resolved.  Please schedule a Follow-up Appointment to: Return if symptoms worsen or fail to improve.  If you have any other questions or concerns, please feel free to call the office or send a message through [place]. You may also schedule an earlier appointment if necessary.  Additionally, you may be receiving a survey about your experience at our office within a few days to 1 week by e-mail or mail. We value your feedback.  Nobie Putnam, DO [place]

## 2022-03-21 ENCOUNTER — Other Ambulatory Visit: Payer: Self-pay

## 2022-03-21 DIAGNOSIS — R7303 Prediabetes: Secondary | ICD-10-CM

## 2022-03-21 DIAGNOSIS — Z0000 Encounter for general adult medical examination without abnormal findings: Secondary | ICD-10-CM

## 2022-03-21 DIAGNOSIS — E7800 Pure hypercholesterolemia, unspecified: Secondary | ICD-10-CM

## 2022-03-22 ENCOUNTER — Other Ambulatory Visit: Payer: BC Managed Care – PPO

## 2022-03-22 DIAGNOSIS — Z0000 Encounter for general adult medical examination without abnormal findings: Secondary | ICD-10-CM | POA: Diagnosis not present

## 2022-03-22 DIAGNOSIS — M542 Cervicalgia: Secondary | ICD-10-CM | POA: Diagnosis not present

## 2022-03-22 DIAGNOSIS — G43009 Migraine without aura, not intractable, without status migrainosus: Secondary | ICD-10-CM | POA: Diagnosis not present

## 2022-03-22 DIAGNOSIS — R7303 Prediabetes: Secondary | ICD-10-CM | POA: Diagnosis not present

## 2022-03-22 DIAGNOSIS — R200 Anesthesia of skin: Secondary | ICD-10-CM | POA: Diagnosis not present

## 2022-03-22 DIAGNOSIS — E7800 Pure hypercholesterolemia, unspecified: Secondary | ICD-10-CM | POA: Diagnosis not present

## 2022-03-22 DIAGNOSIS — M545 Low back pain, unspecified: Secondary | ICD-10-CM | POA: Diagnosis not present

## 2022-03-23 LAB — CBC WITH DIFFERENTIAL/PLATELET
Absolute Monocytes: 483 cells/uL (ref 200–950)
Basophils Absolute: 95 cells/uL (ref 0–200)
Basophils Relative: 0.9 %
Eosinophils Absolute: 357 cells/uL (ref 15–500)
Eosinophils Relative: 3.4 %
HCT: 44.9 % (ref 35.0–45.0)
Hemoglobin: 15.1 g/dL (ref 11.7–15.5)
Lymphs Abs: 2552 cells/uL (ref 850–3900)
MCH: 29.2 pg (ref 27.0–33.0)
MCHC: 33.6 g/dL (ref 32.0–36.0)
MCV: 86.8 fL (ref 80.0–100.0)
MPV: 11.6 fL (ref 7.5–12.5)
Monocytes Relative: 4.6 %
Neutro Abs: 7014 cells/uL (ref 1500–7800)
Neutrophils Relative %: 66.8 %
Platelets: 257 10*3/uL (ref 140–400)
RBC: 5.17 10*6/uL — ABNORMAL HIGH (ref 3.80–5.10)
RDW: 13.3 % (ref 11.0–15.0)
Total Lymphocyte: 24.3 %
WBC: 10.5 10*3/uL (ref 3.8–10.8)

## 2022-03-23 LAB — COMPLETE METABOLIC PANEL WITH GFR
AG Ratio: 1.3 (calc) (ref 1.0–2.5)
ALT: 19 U/L (ref 6–29)
AST: 14 U/L (ref 10–35)
Albumin: 3.9 g/dL (ref 3.6–5.1)
Alkaline phosphatase (APISO): 75 U/L (ref 37–153)
BUN: 8 mg/dL (ref 7–25)
CO2: 26 mmol/L (ref 20–32)
Calcium: 8.7 mg/dL (ref 8.6–10.4)
Chloride: 105 mmol/L (ref 98–110)
Creat: 0.8 mg/dL (ref 0.50–1.03)
Globulin: 2.9 g/dL (calc) (ref 1.9–3.7)
Glucose, Bld: 114 mg/dL — ABNORMAL HIGH (ref 65–99)
Potassium: 4 mmol/L (ref 3.5–5.3)
Sodium: 141 mmol/L (ref 135–146)
Total Bilirubin: 0.3 mg/dL (ref 0.2–1.2)
Total Protein: 6.8 g/dL (ref 6.1–8.1)
eGFR: 88 mL/min/{1.73_m2} (ref >=60)

## 2022-03-23 LAB — LIPID PANEL
Cholesterol: 160 mg/dL (ref <200)
HDL: 35 mg/dL — ABNORMAL LOW (ref >=50)
LDL Cholesterol (Calc): 100 mg/dL (calc) — ABNORMAL HIGH
Non-HDL Cholesterol (Calc): 125 mg/dL (calc) (ref <130)
Total CHOL/HDL Ratio: 4.6 (calc) (ref <5.0)
Triglycerides: 155 mg/dL — ABNORMAL HIGH (ref <150)

## 2022-03-23 LAB — HEMOGLOBIN A1C
Hgb A1c MFr Bld: 6.2 % of total Hgb — ABNORMAL HIGH (ref <5.7)
Mean Plasma Glucose: 131 mg/dL
eAG (mmol/L): 7.3 mmol/L

## 2022-03-23 LAB — TSH: TSH: 2.22 mIU/L

## 2022-03-27 ENCOUNTER — Other Ambulatory Visit: Payer: Self-pay | Admitting: Physician Assistant

## 2022-03-27 DIAGNOSIS — G43009 Migraine without aura, not intractable, without status migrainosus: Secondary | ICD-10-CM

## 2022-03-27 DIAGNOSIS — R200 Anesthesia of skin: Secondary | ICD-10-CM

## 2022-03-28 ENCOUNTER — Encounter: Payer: BC Managed Care – PPO | Admitting: Family Medicine

## 2022-03-29 ENCOUNTER — Other Ambulatory Visit: Payer: Self-pay | Admitting: Family Medicine

## 2022-03-29 DIAGNOSIS — M797 Fibromyalgia: Secondary | ICD-10-CM

## 2022-03-29 DIAGNOSIS — F331 Major depressive disorder, recurrent, moderate: Secondary | ICD-10-CM

## 2022-03-29 NOTE — Telephone Encounter (Signed)
Requested Prescriptions  Pending Prescriptions Disp Refills   DULoxetine (CYMBALTA) 30 MG capsule [Pharmacy Med Name: DULOXETINE DR '30MG'$  CAPSULES] 90 capsule 0    Sig: TAKE 1 CAPSULE(30 MG) BY MOUTH DAILY     Psychiatry: Antidepressants - SNRI - duloxetine Passed - 03/29/2022  3:18 AM      Passed - Cr in normal range and within 360 days    Creat  Date Value Ref Range Status  03/22/2022 0.80 0.50 - 1.03 mg/dL Final         Passed - eGFR is 30 or above and within 360 days    GFR, Estimated  Date Value Ref Range Status  11/09/2020 >60 >60 mL/min Final    Comment:    (NOTE) Calculated using the CKD-EPI Creatinine Equation (2021)    eGFR  Date Value Ref Range Status  03/22/2022 88 > OR = 60 mL/min/1.44m Final         Passed - Completed PHQ-2 or PHQ-9 in the last 360 days      Passed - Last BP in normal range    BP Readings from Last 1 Encounters:  03/06/22 128/88         Passed - Valid encounter within last 6 months    Recent Outpatient Visits           3 weeks ago [place]   [facility] month [facility]   2 months ago Episodic migraine   [place] DO   3 months ago [facility] bilateral low back pain with bilateral sciatica   [facility]   6 months ago Morbid obesity ([facility]   [place]       Future Appointments             In 5 days [place] [facility]  In 2 weeks [facility]

## 2022-04-03 ENCOUNTER — Encounter: Payer: Self-pay | Admitting: Family Medicine

## 2022-04-03 ENCOUNTER — Ambulatory Visit (INDEPENDENT_AMBULATORY_CARE_PROVIDER_SITE_OTHER): Payer: BC Managed Care – PPO | Admitting: Family Medicine

## 2022-04-03 VITALS — BP 122/80 | HR 90 | Ht <= 58 in | Wt 192.4 lb

## 2022-04-03 DIAGNOSIS — M51369 Other intervertebral disc degeneration, lumbar region without mention of lumbar back pain or lower extremity pain: Secondary | ICD-10-CM

## 2022-04-03 DIAGNOSIS — Z23 Encounter for immunization: Secondary | ICD-10-CM | POA: Diagnosis not present

## 2022-04-03 DIAGNOSIS — R7303 Prediabetes: Secondary | ICD-10-CM

## 2022-04-03 DIAGNOSIS — E7800 Pure hypercholesterolemia, unspecified: Secondary | ICD-10-CM

## 2022-04-03 DIAGNOSIS — Z0000 Encounter for general adult medical examination without abnormal findings: Secondary | ICD-10-CM | POA: Diagnosis not present

## 2022-04-03 DIAGNOSIS — M5030 Other cervical disc degeneration, unspecified cervical region: Secondary | ICD-10-CM

## 2022-04-03 DIAGNOSIS — J9801 Acute bronchospasm: Secondary | ICD-10-CM

## 2022-04-03 DIAGNOSIS — G4486 Cervicogenic headache: Secondary | ICD-10-CM

## 2022-04-03 DIAGNOSIS — M5136 Other intervertebral disc degeneration, lumbar region: Secondary | ICD-10-CM

## 2022-04-03 DIAGNOSIS — Z1231 Encounter for screening mammogram for malignant neoplasm of breast: Secondary | ICD-10-CM

## 2022-04-03 DIAGNOSIS — M797 Fibromyalgia: Secondary | ICD-10-CM

## 2022-04-03 DIAGNOSIS — M4802 Spinal stenosis, cervical region: Secondary | ICD-10-CM

## 2022-04-03 MED ORDER — CONTRAVE 8-90 MG PO TB12: ORAL_TABLET | ORAL | 0 refills | Status: DC

## 2022-04-03 MED ORDER — ALBUTEROL SULFATE HFA 108 (90 BASE) MCG/ACT IN AERS: 1.0000 | INHALATION_SPRAY | Freq: Four times a day (QID) | RESPIRATORY_TRACT | 3 refills | Status: DC | PRN

## 2022-04-03 MED ORDER — CYCLOBENZAPRINE HCL 10 MG PO TABS: 10.0000 mg | ORAL_TABLET | Freq: Three times a day (TID) | ORAL | 5 refills | Status: AC

## 2022-04-03 NOTE — Progress Notes (Unsigned)
Subjective:    Patient ID: Sommer Spickard, female    DOB: 21-Oct-1970, 52 y.o.   MRN: [id number]  Lailie Smead is a 52 y.o. female presenting on 04/03/2022 for Annual Exam   HPI  Here for Annual Physical and Lab Review    Morbid Obesity BMI >40 PreDM Interested in GLP1 therapy weight loss management, last visit we investigated medication management and her ins does not cover wt management including GLP1 therapy since she is not a diabetic.  Weight gain 3 lbs in past 3+ months  She is interested in medication to curb appetite. She will continue to do lifestyle intervention diet and exercise.  Elevated A1c 6.2 Admits higher A1c, not eating out as much 3+ years ago in [place] she was weighing about 160 lbs, and had controlled A1c Now weight up 30+ lbs 190s and has higher A1c   Chronic Migraine - improved Cervical Spine DDD / Chronic Neck Pain, spinal stenosis. Followed by Neuro  On Roselyn Meier AS NEEDED Continues on Gabapentin, Flexeril PRN   Established with Dr Melrose Nakayama [facility]) evaluated for cervicogenic headaches / C-spine DDD stenosis, and also migraines. She was given rx Nortriptyline '10mg'$  nightly with instruction to increase to '20mg'$  in future.   Major Depression, recurrent moderate Initial dx 2005, placed on Cymbalta, she was out of insurance and came off med since then. She was also dx Fibromyalgia     Health Maintenance: Due for mammogram - will re order to [facility]. Last done >3+ years in [place], no prior abnormal.   Ulcerative Colitis in remission  Colonoscopy completed 07/07/21  Tobacco Active smoking   Neck Pain / Headaches Improved on medication. Requests Flexeril need order  Shingrix vaccine #2 today     04/03/2022    8:47 AM 01/12/2022   10:05 AM 12/01/2021   10:33 AM  Depression screen PHQ 2/9  Decreased Interest '3 2 2  '$ Down, Depressed, Hopeless '3 2 1  '$ PHQ - 2 Score '6 4 3  '$ Altered sleeping '3 3 1  '$ Tired, decreased energy '3 3 1  '$ Change in appetite  3 3 0  Feeling bad or failure about yourself  3 2 0  Trouble concentrating '3 3 1  '$ Moving slowly or fidgety/restless 3 2   Suicidal thoughts 0 0 0  PHQ-9 Score '24 20 6  '$ Difficult doing work/chores Extremely dIfficult Extremely dIfficult Very difficult      04/03/2022    8:47 AM 01/12/2022   10:06 AM 12/01/2021   10:33 AM 09/21/2021    9:25 AM  GAD 7 : Generalized Anxiety Score  Nervous, Anxious, on Edge '3 2 1 '$ 0  Control/stop worrying '3 2 1 '$ 0  Worry too much - different things '3 2 1 '$ 0  Trouble relaxing '3 3 1 '$ 0  Restless 0 '2 1 1  '$ Easily annoyed or irritable '2 3 1 '$ 0  Afraid - awful might happen 0 0 0 0  Total GAD 7 Score '14 14 6 1  '$ Anxiety Difficulty Somewhat difficult Somewhat difficult Somewhat difficult Not difficult at all      Past Medical History:  Diagnosis Date   Allergy    Anxiety    Asthma    BRCA negative 01/2021   MyRisk neg except AXIN2 VUS; IBIS=7.1%/riskscore=12.9%   Colitis    Diverticulitis    Family history of ovarian cancer    Frequent headaches    Past Surgical History:  Procedure Laterality Date   BACK SURGERY  11/2010   T11-T12, hardware  placement   CESAREAN SECTION     COLONOSCOPY WITH PROPOFOL N/A 07/07/2021   Procedure: COLONOSCOPY WITH PROPOFOL;  Surgeon: Jonathon Bellows, MD;  Location: [facility] ENDOSCOPY;  Service: Gastroenterology;  Laterality: N/A;   LAMINECTOMY AND MICRODISCECTOMY THORACIC SPINE  10/2007   T11-T12   ULNAR NERVE REPAIR  2012   Social History   Socioeconomic History   Marital status: Married    Spouse name: Not on file   Number of children: Not on file   Years of education: Not on file   Highest education level: Not on file  Occupational History   Not on file  Tobacco Use   Smoking status: Every Day    Packs/day: 0.50    Types: Cigarettes    Passive exposure: Past   Smokeless tobacco: Never  Vaping Use   Vaping Use: Never used  Substance and Sexual Activity   Alcohol use: Not Currently   Drug use: Never   Sexual  activity: Yes  Other Topics Concern   Not on file  Social History Narrative   Not on file   Social Determinants of Health   Financial Resource Strain: Not on file  Food Insecurity: Not on file  Transportation Needs: Not on file  Physical Activity: Not on file  Stress: Not on file  Social Connections: Not on file  Intimate Partner Violence: Not on file   Family History  Problem Relation Age of Onset   Heart failure Father    Ovarian cancer Paternal Aunt    Ovarian cancer Paternal Aunt    Current Outpatient Medications on File Prior to Visit  Medication Sig   DULoxetine (CYMBALTA) 30 MG capsule TAKE 1 CAPSULE(30 MG) BY MOUTH DAILY   gabapentin (NEURONTIN) 400 MG capsule Take 400 mg by mouth 3 (three) times daily.   ibuprofen (ADVIL) 800 MG tablet Take 1 tablet (800 mg total) by mouth every 8 (eight) hours as needed for mild pain.   nortriptyline (PAMELOR) 10 MG capsule Take 20 mg by mouth at bedtime.   topiramate (TOPAMAX) 100 MG tablet Take 100 mg by mouth daily.   UBRELVY 100 MG TABS Take 100 mg by mouth as needed (migraine headache). May repeat 1 dose within 2 hours if headache not resolved or returns. Max dose in 24 hours is 2 tablets.   No current facility-administered medications on file prior to visit.    Review of Systems  Constitutional:  Negative for activity change, appetite change, chills, diaphoresis, fatigue and fever.  HENT:  Negative for congestion and hearing loss.   Eyes:  Negative for visual disturbance.  Respiratory:  Negative for cough, chest tightness, shortness of breath and wheezing.   Cardiovascular:  Negative for chest pain, palpitations and leg swelling.  Gastrointestinal:  Negative for abdominal pain, constipation, diarrhea, nausea and vomiting.  Genitourinary:  Negative for dysuria, frequency and hematuria.  Musculoskeletal:  Negative for arthralgias and neck pain.  Skin:  Negative for rash.  Neurological:  Negative for dizziness, weakness,  light-headedness, numbness and headaches.  Hematological:  Negative for adenopathy.  Psychiatric/Behavioral:  Negative for behavioral problems, dysphoric mood and sleep disturbance.    Per HPI unless specifically indicated above      Objective:    BP 122/80   Pulse 90   Ht '4\' 10"'$  (1.473 m)   Wt 192 lb 6.4 oz (87.3 kg)   SpO2 100%   BMI 40.21 kg/m   Wt Readings from Last 3 Encounters:  04/03/22 192 lb 6.4 oz (87.3 kg)  03/06/22 195 lb 12.8 oz (88.8 kg)  02/15/22 197 lb (89.4 kg)    Physical Exam Vitals and nursing note reviewed.  Constitutional:      General: She is not in acute distress.    Appearance: She is well-developed. She is obese. She is not diaphoretic.     Comments: Well-appearing, comfortable, cooperative  HENT:     Head: Normocephalic and atraumatic.  Eyes:     General:        Right eye: No discharge.        Left eye: No discharge.     Conjunctiva/sclera: Conjunctivae normal.     Pupils: Pupils are equal, round, and reactive to light.  Neck:     Thyroid: No thyromegaly.     Vascular: No carotid bruit.  Cardiovascular:     Rate and Rhythm: Normal rate and regular rhythm.     Pulses: Normal pulses.     Heart sounds: Normal heart sounds. No murmur heard. Pulmonary:     Effort: Pulmonary effort is normal. No respiratory distress.     Breath sounds: Normal breath sounds. No wheezing or rales.  Abdominal:     General: Bowel sounds are normal. There is no distension.     Palpations: Abdomen is soft. There is no mass.     Tenderness: There is no abdominal tenderness.  Musculoskeletal:        General: No tenderness. Normal range of motion.     Cervical back: Normal range of motion and neck supple.     Right lower leg: No edema.     Left lower leg: No edema.     Comments: Upper / Lower Extremities: - Normal muscle tone, strength bilateral upper extremities 5/5, lower extremities 5/5  Lymphadenopathy:     Cervical: No cervical adenopathy.  Skin:     General: Skin is warm and dry.     Findings: No erythema or rash.  Neurological:     Mental Status: She is alert and oriented to person, place, and time.     Comments: Distal sensation intact to light touch all extremities  Psychiatric:        Mood and Affect: Mood normal.        Behavior: Behavior normal.        Thought Content: Thought content normal.     Comments: Well groomed, good eye contact, normal speech and thoughts      Results for orders placed or performed in visit on 03/21/22  TSH  Result Value Ref Range   TSH 2.22 mIU/L  Hemoglobin A1c  Result Value Ref Range   Hgb A1c MFr Bld 6.2 (H) <5.7 % of total Hgb   Mean Plasma Glucose 131 mg/dL   eAG (mmol/L) 7.3 mmol/L  Lipid panel  Result Value Ref Range   Cholesterol 160 <200 mg/dL   HDL 35 (L) > OR = 50 mg/dL   Triglycerides 155 (H) <150 mg/dL   LDL Cholesterol (Calc) 100 (H) mg/dL (calc)   Total CHOL/HDL Ratio 4.6 <5.0 (calc)   Non-HDL Cholesterol (Calc) 125 <130 mg/dL (calc)  CBC with Differential/Platelet  Result Value Ref Range   WBC 10.5 3.8 - 10.8 Thousand/uL   RBC 5.17 (H) 3.80 - 5.10 Million/uL   Hemoglobin 15.1 11.7 - 15.5 g/dL   HCT 44.9 35.0 - 45.0 %   MCV 86.8 80.0 - 100.0 fL   MCH 29.2 27.0 - 33.0 pg   MCHC 33.6 32.0 - 36.0 g/dL   RDW 13.3 11.0 -  15.0 %   Platelets 257 140 - 400 Thousand/uL   MPV 11.6 7.5 - 12.5 fL   Neutro Abs 7,014 1,500 - 7,800 cells/uL   Lymphs Abs 2,552 850 - 3,900 cells/uL   Absolute Monocytes 483 200 - 950 cells/uL   Eosinophils Absolute 357 15 - 500 cells/uL   Basophils Absolute 95 0 - 200 cells/uL   Neutrophils Relative % 66.8 %   Total Lymphocyte 24.3 %   Monocytes Relative 4.6 %   Eosinophils Relative 3.4 %   Basophils Relative 0.9 %  COMPLETE METABOLIC PANEL WITH GFR  Result Value Ref Range   Glucose, Bld 114 (H) 65 - 99 mg/dL   BUN 8 7 - 25 mg/dL   Creat 0.80 0.50 - 1.03 mg/dL   eGFR 88 > OR = 60 mL/min/1.51m   BUN/Creatinine Ratio SEE NOTE: 6 - 22 (calc)    Sodium 141 135 - 146 mmol/L   Potassium 4.0 3.5 - 5.3 mmol/L   Chloride 105 98 - 110 mmol/L   CO2 26 20 - 32 mmol/L   Calcium 8.7 8.6 - 10.4 mg/dL   Total Protein 6.8 6.1 - 8.1 g/dL   Albumin 3.9 3.6 - 5.1 g/dL   Globulin 2.9 1.9 - 3.7 g/dL (calc)   AG Ratio 1.3 1.0 - 2.5 (calc)   Total Bilirubin 0.3 0.2 - 1.2 mg/dL   Alkaline phosphatase (APISO) 75 37 - 153 U/L   AST 14 10 - 35 U/L   ALT 19 6 - 29 U/L      Assessment & Plan:   Problem List Items Addressed This Visit     Cervical spinal stenosis   Relevant Medications   cyclobenzaprine (FLEXERIL) 10 MG tablet   DDD (degenerative disc disease), cervical   Relevant Medications   cyclobenzaprine (FLEXERIL) 10 MG tablet   DDD (degenerative disc disease), lumbar   Relevant Medications   cyclobenzaprine (FLEXERIL) 10 MG tablet   Fibromyalgia    Re order Flexeril Continue current therapy      Relevant Medications   topiramate (TOPAMAX) 100 MG tablet   cyclobenzaprine (FLEXERIL) 10 MG tablet   Morbid obesity (HCC)    Morbid Obesity BMI >40 Comorbid features HYPERTENSION HLD mood, Pre Diab Discussion on medication options. She was unsuccessul on prior GLP1 inj was unavailable and not covered Will pursue Contrave rx today      Relevant Medications   CONTRAVE 8-90 MG TB12   Pre-diabetes    Elevated A1c 6.2 Concern with obesity, HTN, HLD Plan:  1. Not on any therapy currently  2. Encourage improved lifestyle - low carb, low sugar diet, reduce portion size, emphasize improving regular exercise 3. Follow-up 4 month       Pure hypercholesterolemia    Stable LDL 100 The 10-year ASCVD risk score (Arnett DK, et al., 2019) is: 4.8%  Plan: Encourage improved lifestyle - low carb/cholesterol, reduce portion size, continue improving regular exercise      Other Visit Diagnoses     Annual physical exam    -  Primary   Bronchospasm       Relevant Medications   albuterol (VENTOLIN HFA) 108 (90 Base) MCG/ACT inhaler    Cervicogenic headache       Relevant Medications   topiramate (TOPAMAX) 100 MG tablet   cyclobenzaprine (FLEXERIL) 10 MG tablet   Encounter for screening mammogram for malignant neoplasm of breast       Relevant Orders   MM 3D SCREEN BREAST BILATERAL   Need  for shingles vaccine       Relevant Orders   Varicella-zoster vaccine IM (Completed)       Updated Health Maintenance information Reviewed recent lab results with patient Encouraged improvement to lifestyle with diet and exercise Goal of weight loss    Meds ordered this encounter  Medications   CONTRAVE 8-90 MG TB12    Sig: Week 1: Take 1 tab daily with breakfast. Week 2: Take 1 tab twice daily with meal. Week 3: Take 2 tab with AM meal and 1 tab with PM meal. Week 4+: Take 2 tabs twice daily with meals - continue for weight loss    Dispense:  120 tablet    Refill:  0   albuterol (VENTOLIN HFA) 108 (90 Base) MCG/ACT inhaler    Sig: Inhale 1-2 puffs into the lungs every 6 (six) hours as needed for wheezing or shortness of breath.    Dispense:  8 g    Refill:  3   cyclobenzaprine (FLEXERIL) 10 MG tablet    Sig: Take 1 tablet (10 mg total) by mouth 3 (three) times daily.    Dispense:  90 tablet    Refill:  5     Follow up plan: Return in about 4 months (around 08/02/2022) for 4 month follow-up PreDM A1c, Weight check updates.  Nobie Putnam, [facility] 04/03/2022, 9:09 AM

## 2022-04-03 NOTE — Patient Instructions (Addendum)
Thank you for coming to the office today.  Cholesterol panel looks great, much improved. Keep up with the good food choices!   Recent Labs    03/22/22 0807  HGBA1C 6.2*    -------------------------  Contrave - oral medication, appetite suppression has wellbutrin/bupropion and naltrexone in it and it can also help with appetite, it is ordered through a speciality pharmacy. $99 per month  [facility] Address: [address], [place] [address] Phone: [phone]  -----  Please check into weight management options  - [employer] - Weight Watchers - MyFitnessPal  Facebook Group $300 lifetime membership, invite to group, can work at own pace, videos, recording, meals shipped "E2M, Eager 2 Motivate, out of [place]"    WEIGHT MANAGEMENT  Dr Dennard Nip  [facility] [address] [place], [place] [address] Ph: [phone]    Please schedule a Follow-up Appointment to: Return in about 4 months (around 08/02/2022) for 4 month follow-up PreDM A1c, Weight check updates.  If you have any other questions or concerns, please feel free to call the office or send a message through [place]. You may also schedule an earlier appointment if necessary.  Additionally, you may be receiving a survey about your experience at our office within a few days to 1 week by e-mail or mail. We value your feedback.  Nobie Putnam, DO [place]

## 2022-04-04 NOTE — Assessment & Plan Note (Signed)
Stable LDL 100 The 10-year ASCVD risk score (Arnett DK, et al., 2019) is: 4.8%  Plan: Encourage improved lifestyle - low carb/cholesterol, reduce portion size, continue improving regular exercise

## 2022-04-04 NOTE — Assessment & Plan Note (Signed)
Elevated A1c 6.2 Concern with obesity, HTN, HLD Plan:  1. Not on any therapy currently  2. Encourage improved lifestyle - low carb, low sugar diet, reduce portion size, emphasize improving regular exercise 3. Follow-up 4 month

## 2022-04-04 NOTE — Assessment & Plan Note (Signed)
Morbid Obesity BMI >40 Comorbid features HYPERTENSION HLD mood, Pre Diab Discussion on medication options. She was unsuccessul on prior GLP1 inj was unavailable and not covered Will pursue Contrave rx today

## 2022-04-04 NOTE — Assessment & Plan Note (Addendum)
Re order Flexeril Continue current therapy

## 2022-04-05 DIAGNOSIS — M5134 Other intervertebral disc degeneration, thoracic region: Secondary | ICD-10-CM | POA: Diagnosis not present

## 2022-04-05 DIAGNOSIS — M5030 Other cervical disc degeneration, unspecified cervical region: Secondary | ICD-10-CM | POA: Diagnosis not present

## 2022-04-05 DIAGNOSIS — M5020 Other cervical disc displacement, unspecified cervical region: Secondary | ICD-10-CM | POA: Diagnosis not present

## 2022-04-05 DIAGNOSIS — M546 Pain in thoracic spine: Secondary | ICD-10-CM | POA: Diagnosis not present

## 2022-04-05 DIAGNOSIS — M5136 Other intervertebral disc degeneration, lumbar region: Secondary | ICD-10-CM | POA: Diagnosis not present

## 2022-04-05 DIAGNOSIS — M545 Low back pain, unspecified: Secondary | ICD-10-CM | POA: Diagnosis not present

## 2022-04-14 ENCOUNTER — Ambulatory Visit: Payer: BC Managed Care – PPO | Admitting: Family Medicine

## 2022-04-18 ENCOUNTER — Other Ambulatory Visit: Payer: Self-pay | Admitting: Family Medicine

## 2022-04-18 DIAGNOSIS — M5414 Radiculopathy, thoracic region: Secondary | ICD-10-CM

## 2022-04-18 DIAGNOSIS — M5416 Radiculopathy, lumbar region: Secondary | ICD-10-CM

## 2022-04-25 ENCOUNTER — Ambulatory Visit
Admission: RE | Admit: 2022-04-25 | Discharge: 2022-04-25 | Disposition: A | Discharge status: Home / Self Care | Payer: BC Managed Care – PPO | Source: Ambulatory Visit | Attending: Physician Assistant | Admitting: Physician Assistant

## 2022-04-25 DIAGNOSIS — R200 Anesthesia of skin: Secondary | ICD-10-CM

## 2022-04-25 DIAGNOSIS — G43009 Migraine without aura, not intractable, without status migrainosus: Secondary | ICD-10-CM | POA: Diagnosis not present

## 2022-04-25 DIAGNOSIS — R202 Paresthesia of skin: Secondary | ICD-10-CM

## 2022-04-25 MED ORDER — GADOPICLENOL 0.5 MMOL/ML IV SOLN
7.5000 mL | Freq: Once | INTRAVENOUS | Status: AC | PRN
  Administered 2022-04-25: 7.5 mL via INTRAVENOUS

## 2022-04-26 ENCOUNTER — Ambulatory Visit (INDEPENDENT_AMBULATORY_CARE_PROVIDER_SITE_OTHER): Payer: BC Managed Care – PPO | Admitting: Family Medicine

## 2022-04-26 ENCOUNTER — Encounter: Payer: Self-pay | Admitting: Family Medicine

## 2022-04-26 VITALS — BP 126/64 | HR 86 | Ht <= 58 in | Wt 191.2 lb

## 2022-04-26 DIAGNOSIS — G43909 Migraine, unspecified, not intractable, without status migrainosus: Secondary | ICD-10-CM | POA: Diagnosis not present

## 2022-04-26 DIAGNOSIS — F331 Major depressive disorder, recurrent, moderate: Secondary | ICD-10-CM | POA: Diagnosis not present

## 2022-04-26 NOTE — Progress Notes (Signed)
Subjective:    Patient ID: Megan Hawkins, female    DOB: 03/24/1970, 52 y.o.   MRN: [id number]  Megan Hawkins is a 51 y.o. female presenting on 04/26/2022 for Migraine   HPI  Migraine Headaches, Episodic Cervical Spine DDD / Chronic Neck Pain, spinal stenosis. Followed by Neuro, Jefm Bryant  On Roselyn Meier AS NEEDED Continues on Gabapentin, Flexeril AS NEEDED She is improved on migraines with Roselyn Meier abortive therapy Pending MRI results and Neuro follow-up   Here for work note to return.  First date missed out of work due to leave of absence for migraine headaches / depression - First date out 04/07/22 - First date return to work 04/27/22   Major Depression recurrent Chronic problem Improved. On Duloxetine '30mg'$  daily      04/26/2022    9:53 AM 04/03/2022    8:47 AM 01/12/2022   10:05 AM  Depression screen PHQ 2/9  Decreased Interest '1 3 2  '$ Down, Depressed, Hopeless '2 3 2  '$ PHQ - 2 Score '3 6 4  '$ Altered sleeping '2 3 3  '$ Tired, decreased energy '2 3 3  '$ Change in appetite 0 3 3  Feeling bad or failure about yourself  '1 3 2  '$ Trouble concentrating '1 3 3  '$ Moving slowly or fidgety/restless '2 3 2  '$ Suicidal thoughts 0 0 0  PHQ-9 Score '11 24 20  '$ Difficult doing work/chores Somewhat difficult Extremely dIfficult Extremely dIfficult      04/26/2022    9:53 AM 04/03/2022    8:47 AM 01/12/2022   10:06 AM 12/01/2021   10:33 AM  GAD 7 : Generalized Anxiety Score  Nervous, Anxious, on Edge 0 '3 2 1  '$ Control/stop worrying '1 3 2 1  '$ Worry too much - different things '1 3 2 1  '$ Trouble relaxing 0 '3 3 1  '$ Restless 0 0 2 1  Easily annoyed or irritable '1 2 3 1  '$ Afraid - awful might happen 0 0 0 0  Total GAD 7 Score '3 14 14 6  '$ Anxiety Difficulty Somewhat difficult Somewhat difficult Somewhat difficult Somewhat difficult      Social History   Tobacco Use   Smoking status: Every Day    Packs/day: 0.50    Types: Cigarettes    Passive exposure: Past   Smokeless tobacco: Never  Vaping Use    Vaping Use: Never used  Substance Use Topics   Alcohol use: Not Currently   Drug use: Never    Review of Systems Per HPI unless specifically indicated above     Objective:    BP 126/64   Pulse 86   Ht '4\' 10"'$  (1.473 m)   Wt 191 lb 3.2 oz (86.7 kg)   SpO2 99%   BMI 39.96 kg/m   Wt Readings from Last 3 Encounters:  04/26/22 191 lb 3.2 oz (86.7 kg)  04/03/22 192 lb 6.4 oz (87.3 kg)  03/06/22 195 lb 12.8 oz (88.8 kg)    Physical Exam Vitals and nursing note reviewed.  Constitutional:      General: She is not in acute distress.    Appearance: She is well-developed. She is not diaphoretic.     Comments: Well-appearing, comfortable, cooperative  HENT:     Head: Normocephalic and atraumatic.  Eyes:     General:        Right eye: No discharge.        Left eye: No discharge.     Conjunctiva/sclera: Conjunctivae normal.  Neck:     Thyroid: No  thyromegaly.  Cardiovascular:     Rate and Rhythm: Normal rate and regular rhythm.     Heart sounds: Normal heart sounds. No murmur heard. Pulmonary:     Effort: Pulmonary effort is normal. No respiratory distress.     Breath sounds: Normal breath sounds. No wheezing or rales.  Musculoskeletal:        General: Normal range of motion.     Cervical back: Normal range of motion and neck supple.  Lymphadenopathy:     Cervical: No cervical adenopathy.  Skin:    General: Skin is warm and dry.     Findings: No erythema or rash.  Neurological:     Mental Status: She is alert and oriented to person, place, and time.  Psychiatric:        Behavior: Behavior normal.     Comments: Well groomed, good eye contact, normal speech and thoughts     I have personally reviewed the radiology report from 04/25/22 MRI Brain  [place] [id number] Resulted: 04/25/22 1732  Order Status: Completed Updated: 04/25/22 1734  Narrative:    CLINICAL DATA:  Provided history: Migraine without aura and without status migrainosus, not intractable.  Numbness and tingling. Facial numbness. Additional history provided by the scanning technologist: The patient reports severe migraine headaches, numbness and "pins and needles" sensation in face.  EXAM: MRI HEAD WITHOUT AND WITH CONTRAST  TECHNIQUE: Multiplanar, multiecho pulse sequences of the brain and surrounding structures were obtained without and with intravenous contrast.  CONTRAST:  7.5 mL Vueway intravenous contrast.  COMPARISON:  Head CT 11/10/2020. Cervical spine MRI 02/03/2022.  FINDINGS: The axial T1-weighted post-contrast sequence is mildly motion degraded.  Brain:  No age advanced or lobar predominant parenchymal atrophy.  There are a few small nonspecific foci of T2 FLAIR hyperintense signal abnormality scattered within the bilateral frontal lobe white matter (measuring up to 6 mm).  Partially empty sella turcica.  There is no acute infarct.  No evidence of an intracranial mass.  No chronic intracranial blood products.  No extra-axial fluid collection.  No midline shift.  No pathologic intracranial enhancement identified.  Vascular: Maintained flow voids within the proximal large arterial vessels.  Skull and upper cervical spine: No focal suspicious marrow lesion.  Sinuses/Orbits: No mass or acute finding within the imaged orbits. No significant paranasal sinus disease.  Other: 12 mm ovoid T2 hypointense lesion within the right parietal scalp, not significantly changed in size from the head CT of 11/10/2020 and likely benign.  IMPRESSION: 1. Mildly motion degraded exam. 2. No evidence of an acute intracranial abnormality. 3. There are a few small nonspecific T2 FLAIR hyperintense remote insults within the bilateral frontal lobe white matter (measuring up to 6 mm). 4. Partially empty sella turcica. This finding can reflect incidental anatomic variation, or alternatively, it can be associated with idiopathic intracranial hypertension  (pseudotumor cerebri).   Electronically Signed   By: Kellie Simmering D.O.   On: 04/25/2022 17:32     Results for orders placed or performed in visit on 03/21/22  TSH  Result Value Ref Range   TSH 2.22 mIU/L  Hemoglobin A1c  Result Value Ref Range   Hgb A1c MFr Bld 6.2 (H) <5.7 % of total Hgb   Mean Plasma Glucose 131 mg/dL   eAG (mmol/L) 7.3 mmol/L  Lipid panel  Result Value Ref Range   Cholesterol 160 <200 mg/dL   HDL 35 (L) > OR = 50 mg/dL   Triglycerides 155 (H) <150  mg/dL   LDL Cholesterol (Calc) 100 (H) mg/dL (calc)   Total CHOL/HDL Ratio 4.6 <5.0 (calc)   Non-HDL Cholesterol (Calc) 125 <130 mg/dL (calc)  CBC with Differential/Platelet  Result Value Ref Range   WBC 10.5 3.8 - 10.8 Thousand/uL   RBC 5.17 (H) 3.80 - 5.10 Million/uL   Hemoglobin 15.1 11.7 - 15.5 g/dL   HCT 44.9 35.0 - 45.0 %   MCV 86.8 80.0 - 100.0 fL   MCH 29.2 27.0 - 33.0 pg   MCHC 33.6 32.0 - 36.0 g/dL   RDW 13.3 11.0 - 15.0 %   Platelets 257 140 - 400 Thousand/uL   MPV 11.6 7.5 - 12.5 fL   Neutro Abs 7,014 1,500 - 7,800 cells/uL   Lymphs Abs 2,552 850 - 3,900 cells/uL   Absolute Monocytes 483 200 - 950 cells/uL   Eosinophils Absolute 357 15 - 500 cells/uL   Basophils Absolute 95 0 - 200 cells/uL   Neutrophils Relative % 66.8 %   Total Lymphocyte 24.3 %   Monocytes Relative 4.6 %   Eosinophils Relative 3.4 %   Basophils Relative 0.9 %  COMPLETE METABOLIC PANEL WITH GFR  Result Value Ref Range   Glucose, Bld 114 (H) 65 - 99 mg/dL   BUN 8 7 - 25 mg/dL   Creat 0.80 0.50 - 1.03 mg/dL   eGFR 88 > OR = 60 mL/min/1.30m   BUN/Creatinine Ratio SEE NOTE: 6 - 22 (calc)   Sodium 141 135 - 146 mmol/L   Potassium 4.0 3.5 - 5.3 mmol/L   Chloride 105 98 - 110 mmol/L   CO2 26 20 - 32 mmol/L   Calcium 8.7 8.6 - 10.4 mg/dL   Total Protein 6.8 6.1 - 8.1 g/dL   Albumin 3.9 3.6 - 5.1 g/dL   Globulin 2.9 1.9 - 3.7 g/dL (calc)   AG Ratio 1.3 1.0 - 2.5 (calc)   Total Bilirubin 0.3 0.2 - 1.2 mg/dL    Alkaline phosphatase (APISO) 75 37 - 153 U/L   AST 14 10 - 35 U/L   ALT 19 6 - 29 U/L      Assessment & Plan:   Problem List Items Addressed This Visit     Episodic migraine - Primary   Major depressive disorder, recurrent, moderate (HCC)    Return to work note completed  We will work on any paperwork for the leave if it arrives.  Keep on ubrelvy as needed for migraines  Follow with Neurology on MRI and other treatment  For the mood, we can increase Duloxetine from 30 to '60mg'$  if you are interested to see if this helps more. Just let me know on mychart   No orders of the defined types were placed in this encounter.     Follow up plan: Return if symptoms worsen or fail to improve.    ANobie Putnam DNew DealMedical Group 04/26/2022, 10:25 AM

## 2022-04-26 NOTE — Patient Instructions (Addendum)
Thank you for coming to the office today.  Return to work note completed  We will work on any paperwork for the leave if it arrives.  Keep on ubrelvy as needed for migraines  Follow with Neurology on MRI and other treatment  For the mood, we can increase Duloxetine from 30 to '60mg'$  if you are interested to see if this helps more. Just let me know on mychart   Please schedule a Follow-up Appointment to: Return if symptoms worsen or fail to improve.  If you have any other questions or concerns, please feel free to call the office or send a message through [facility]. You may also schedule an earlier appointment if necessary.  Additionally, you may be receiving a survey about your experience at our office within a few days to 1 week by e-mail or mail. We value your feedback.  Nobie Putnam, DO [place]

## 2022-05-03 ENCOUNTER — Ambulatory Visit
Admission: RE | Admit: 2022-05-03 | Discharge: 2022-05-03 | Disposition: A | Discharge status: Home / Self Care | Payer: BC Managed Care – PPO | Source: Ambulatory Visit | Attending: Family Medicine | Admitting: Family Medicine

## 2022-05-03 DIAGNOSIS — M5416 Radiculopathy, lumbar region: Secondary | ICD-10-CM

## 2022-05-03 DIAGNOSIS — M5414 Radiculopathy, thoracic region: Secondary | ICD-10-CM

## 2022-05-03 DIAGNOSIS — M5136 Other intervertebral disc degeneration, lumbar region: Secondary | ICD-10-CM | POA: Diagnosis not present

## 2022-05-03 DIAGNOSIS — M40204 Unspecified kyphosis, thoracic region: Secondary | ICD-10-CM | POA: Diagnosis not present

## 2022-05-05 DIAGNOSIS — M5416 Radiculopathy, lumbar region: Secondary | ICD-10-CM | POA: Diagnosis not present

## 2022-05-05 DIAGNOSIS — M5136 Other intervertebral disc degeneration, lumbar region: Secondary | ICD-10-CM | POA: Diagnosis not present

## 2022-05-05 DIAGNOSIS — G629 Polyneuropathy, unspecified: Secondary | ICD-10-CM | POA: Diagnosis not present

## 2022-05-08 DIAGNOSIS — M542 Cervicalgia: Secondary | ICD-10-CM | POA: Diagnosis not present

## 2022-05-08 DIAGNOSIS — R200 Anesthesia of skin: Secondary | ICD-10-CM | POA: Diagnosis not present

## 2022-05-08 DIAGNOSIS — G43009 Migraine without aura, not intractable, without status migrainosus: Secondary | ICD-10-CM | POA: Diagnosis not present

## 2022-05-08 DIAGNOSIS — M545 Low back pain, unspecified: Secondary | ICD-10-CM | POA: Diagnosis not present

## 2022-05-10 DIAGNOSIS — G43009 Migraine without aura, not intractable, without status migrainosus: Secondary | ICD-10-CM | POA: Diagnosis not present

## 2022-05-10 DIAGNOSIS — R202 Paresthesia of skin: Secondary | ICD-10-CM | POA: Diagnosis not present

## 2022-05-10 DIAGNOSIS — Z114 Encounter for screening for human immunodeficiency virus [HIV]: Secondary | ICD-10-CM | POA: Diagnosis not present

## 2022-05-10 DIAGNOSIS — M542 Cervicalgia: Secondary | ICD-10-CM | POA: Diagnosis not present

## 2022-05-10 DIAGNOSIS — E559 Vitamin D deficiency, unspecified: Secondary | ICD-10-CM | POA: Diagnosis not present

## 2022-05-10 DIAGNOSIS — M545 Low back pain, unspecified: Secondary | ICD-10-CM | POA: Diagnosis not present

## 2022-05-10 DIAGNOSIS — R200 Anesthesia of skin: Secondary | ICD-10-CM | POA: Diagnosis not present

## 2022-05-24 DIAGNOSIS — E538 Deficiency of other specified B group vitamins: Secondary | ICD-10-CM | POA: Diagnosis not present

## 2022-06-08 DIAGNOSIS — R202 Paresthesia of skin: Secondary | ICD-10-CM | POA: Diagnosis not present

## 2022-06-19 ENCOUNTER — Encounter: Payer: Self-pay | Admitting: Family Medicine

## 2022-06-19 ENCOUNTER — Ambulatory Visit (INDEPENDENT_AMBULATORY_CARE_PROVIDER_SITE_OTHER): Payer: BC Managed Care – PPO | Admitting: Family Medicine

## 2022-06-19 VITALS — BP 115/74 | HR 82 | Ht <= 58 in | Wt 192.0 lb

## 2022-06-19 DIAGNOSIS — R41840 Attention and concentration deficit: Secondary | ICD-10-CM | POA: Diagnosis not present

## 2022-06-19 DIAGNOSIS — F09 Unspecified mental disorder due to known physiological condition: Secondary | ICD-10-CM

## 2022-06-19 DIAGNOSIS — R413 Other amnesia: Secondary | ICD-10-CM

## 2022-06-19 DIAGNOSIS — G43909 Migraine, unspecified, not intractable, without status migrainosus: Secondary | ICD-10-CM

## 2022-06-19 DIAGNOSIS — R2689 Other abnormalities of gait and mobility: Secondary | ICD-10-CM

## 2022-06-19 NOTE — Progress Notes (Unsigned)
Subjective:    Patient ID: Megan Hawkins, female    DOB: 10-11-1970, 52 y.o.   MRN: [id number]  Megan Hawkins is a 52 y.o. female presenting on 06/19/2022 for Migraine (Needs FMLA paperwork) and Memory Loss (Patient hasn't worked since April 11)   HPI  Episodic Migraine Headaches Memory Loss, mid conversation Balance Disorder Hallucinations  Cervical Spine DDD / Chronic Neck Pain, spinal stenosis. Chronic Low Back Pain   Reports constellation of symptoms worsening recently, managed by Neurology at [facility]  Previous treatment by Neuro in 02/2022 her Topamax was increased to  seems to be related to her onset of side effects and symptoms  Reports difficulty with imbalance and balance now, walks with a cane at times and loses balance if lean backwards She admits some tremors Describes hallucination with talking to someone who wasn't in room  These are problems at work  Last visit with me 04/26/22 for similar issues.  On Ubrelvy AS NEEDED for migraines  Continues on Gabapentin, Flexeril AS NEEDED  She is improved on migraines with Bernita Raisin abortive therapy  Vitamin D and B12 was low deficiency Treated by Neuro right now, B12 shots and supplements  They Referred to Psychiatry + ENT + Sleep Study She has not been able to schedule with Psych but they are trying to contact her Sleep Study scheduled for June 2024 Vertigo - spinning episodes, awaiting apt with ENT  She is now on Qulipta daily  daily for migraine prevention Continued Gabapentin and weaning off Topamax per Neuro  Stress worsens symptoms May have side effects on Topamax  She is requesting renewal on her Intermittent FMLA and also Short Term Disability  She has chronic pain and continues on Duloxetine, Gabapentin, Flexeril.      06/19/2022    4:12 PM 04/26/2022    9:53 AM 04/03/2022    8:47 AM  Depression screen PHQ 2/9  Decreased Interest Down, Depressed, Hopeless PHQ - 2 Score  Altered sleeping Tired, decreased energy Change in appetite 0 0 3  Feeling bad or failure about yourself  0 1 3  Trouble concentrating Moving slowly or fidgety/restless 0 2 3  Suicidal thoughts 0 0 0  PHQ-9 Score Difficult doing work/chores  Somewhat difficult Extremely dIfficult      06/19/2022    4:12 PM 04/26/2022    9:53 AM 04/03/2022    8:47 AM 01/12/2022   10:06 AM  GAD 7 : Generalized Anxiety Score  Nervous, Anxious, on Edge 0 0 3 2  Control/stop worrying 0 Worry too much - different things 0 Trouble relaxing 0 0 3 3  Restless 0 0 0 2  Easily annoyed or irritable 0 Afraid - awful might happen 0 0 0 0  Total GAD 7 Score 0 Anxiety Difficulty  Somewhat difficult Somewhat difficult Somewhat difficult      Social History   Tobacco Use   Smoking status: Every Day    Packs/day: .5    Types: Cigarettes    Passive exposure: Past   Smokeless tobacco: Never  Vaping Use   Vaping Use: Never used  Substance Use Topics   Alcohol use: Not Currently   Drug use: Never    Review of Systems Per HPI unless specifically indicated  above     Objective:    BP 115/74   Pulse 82   Ht 4\' 10"  (1.473 m)   Wt 192 lb (87.1 kg)   BMI 40.13 kg/m   Wt Readings from Last 3 Encounters:  06/19/22 192 lb (87.1 kg)  04/26/22 191 lb 3.2 oz (86.7 kg)  04/03/22 192 lb 6.4 oz (87.3 kg)    Physical Exam Vitals and nursing note reviewed.  Constitutional:      General: She is not in acute distress.    Appearance: She is well-developed. She is not diaphoretic.     Comments: Well-appearing, comfortable, cooperative  HENT:     Head: Normocephalic and atraumatic.  Eyes:     General:        Right eye: No discharge.        Left eye: No discharge.     Conjunctiva/sclera: Conjunctivae normal.  Neck:     Thyroid: No thyromegaly.  Cardiovascular:     Rate and Rhythm: Normal rate and regular rhythm.     Heart sounds: Normal  heart sounds. No murmur heard. Pulmonary:     Effort: Pulmonary effort is normal. No respiratory distress.     Breath sounds: Normal breath sounds. No wheezing or rales.  Musculoskeletal:     Cervical back: Neck supple.     Comments: Some reduced range of motion with neck and low back.  Lymphadenopathy:     Cervical: No cervical adenopathy.  Skin:    General: Skin is warm and dry.     Findings: No erythema or rash.  Neurological:     Mental Status: She is alert and oriented to person, place, and time.  Psychiatric:        Behavior: Behavior normal.     Comments: Well groomed, good eye contact. Occasional difficulty with word finding or losing train of thought in conversation. Conversational short term recall is limited.    I have personally reviewed the radiology report from 04/25/22 on MRI Brain.  CLINICAL DATA:  Provided history: Migraine without aura and without status migrainosus, not intractable. Numbness and tingling. Facial numbness. Additional history provided by the scanning technologist: The patient reports severe migraine headaches, numbness and "pins and needles" sensation in face.   EXAM: MRI HEAD WITHOUT AND WITH CONTRAST   TECHNIQUE: Multiplanar, multiecho pulse sequences of the brain and surrounding structures were obtained without and with intravenous contrast.   CONTRAST:  7.5 mL Vueway intravenous contrast.   COMPARISON:  Head CT 11/10/2020. Cervical spine MRI 02/03/2022.   FINDINGS: The axial T1-weighted post-contrast sequence is mildly motion degraded.   Brain:   No age advanced or lobar predominant parenchymal atrophy.   There are a few small nonspecific foci of T2 FLAIR hyperintense signal abnormality scattered within the bilateral frontal lobe white matter (measuring up to 6 mm).   Partially empty sella turcica.   There is no acute infarct.   No evidence of an intracranial mass.   No chronic intracranial blood products.   No extra-axial  fluid collection.   No midline shift.   No pathologic intracranial enhancement identified.   Vascular: Maintained flow voids within the proximal large arterial vessels.   Skull and upper cervical spine: No focal suspicious marrow lesion.   Sinuses/Orbits: No mass or acute finding within the imaged orbits. No significant paranasal sinus disease.   Other: 12 mm ovoid T2 hypointense lesion within the right parietal scalp, not significantly changed in size from the head CT of 11/10/2020 and  likely benign.   IMPRESSION: 1. Mildly motion degraded exam. 2. No evidence of an acute intracranial abnormality. 3. There are a few small nonspecific T2 FLAIR hyperintense remote insults within the bilateral frontal lobe white matter (measuring up to 6 mm). 4. Partially empty sella turcica. This finding can reflect incidental anatomic variation, or alternatively, it can be associated with idiopathic intracranial hypertension (pseudotumor cerebri).     Electronically Signed   By: Jackey Loge D.O.   On: 04/25/2022 17:32  ----  I have personally reviewed the radiology report from 05/03/22 on Lumbar MRI.  Narrative & Impression  CLINICAL DATA:  Thoracic radiculopathy M54.14 (ICD-10-CM). Lumbar radiculopathy M54.16 (ICD-10-CM).   EXAM: MRI THORACIC AND LUMBAR SPINE WITHOUT CONTRAST   TECHNIQUE: Multiplanar and multiecho pulse sequences of the thoracic and lumbar spine were obtained without intravenous contrast.   COMPARISON:  None Available.   FINDINGS: MRI THORACIC SPINE FINDINGS   Alignment:  Mildly exaggerated thoracic kyphosis.   Vertebrae: No fracture, evidence of discitis, or aggressive bone lesion. Hemangiomas scattered throughout the vertebral bodies, the largest at T12. Endplate degenerative changes with associated marrow edema at T7-8. Postsurgical changes from laminectomy and left-sided posterior transpedicular fusion at T11-12.   Cord: Disc protrusions causing  minimal mass effect on the anterior cord surface at T6-7, T7-8 and T8-9. No cord signal abnormality.   Paraspinal and other soft tissues: Negative.   Disc levels:   T1-2:No spinal canal or neural foraminal stenosis.   T2-3:No spinal canal or neural foraminal stenosis.   T3-4:Shallow disc bulge causing small indentation on the thecal sac. No significant spinal canal or neural foraminal stenosis.   T4-5:Shallow disc bulge causing small indentation on the thecal sac. No significant spinal canal or neural foraminal stenosis.   T5-6:Shallow disc bulge causing small indentation on the thecal sac. No significant spinal canal or neural foraminal stenosis.   T6-7:Left posterolateral disc protrusion indentation the thecal sac and on the anterior cord surface. No significant spinal canal or neural foraminal stenosis.   T7-8:Central disc protrusion causing indentation on the thecal sac and on the anterior cord surface without significant spinal stenosis.   T8-9:Disc bulge with superimposed right posterolateral disc protrusion causing indentation of the thecal sac abutting the anterior cord surface. No significant spinal canal or neural foraminal stenosis.   T9-10:Facet degenerative changes. No significant spinal canal or neural foraminal stenosis.   T10-11:No spinal canal or neural foraminal stenosis.   T11-12:Right posterolateral disc protrusion causing indentation on the thecal sac. No significant spinal canal or neural foraminal stenosis.   MRI LUMBAR SPINE FINDINGS   Segmentation: A transitional lumbosacral vertebra is assumed to represent a partially sacralized L5 level. Careful correlation with this numbering strategy prior to any procedural intervention would be recommended.   Alignment:  Physiologic.   Vertebrae: No fracture, evidence of discitis, or aggressive bone lesion. Hemangiomas scattered throughout the vertebral bodies, the largest ones at L1 and L3.   Conus  medullaris and cauda equina: Conus extends to the T12-L1 level. Conus and cauda equina appear normal.   Paraspinal and other soft tissues: Negative.   Disc levels:   T12-L1: No spinal canal or neural foraminal stenosis.   L1-2: No spinal canal or neural foraminal stenosis.   L2-3: Small right foraminal disc protrusion and mild facet degenerative changes resulting in mild-to-moderate right neural foraminal narrowing. No spinal canal stenosis.   L3-4: Mild facet degenerative changes. No spinal canal or neural foraminal stenosis.   L4-5: Mild facet degenerative changes.  No spinal canal or neural foraminal stenosis.   L5-S1: Mild facet degenerative changes. No spinal canal or neural foraminal stenosis.   IMPRESSION: 1. Degenerative changes of the thoracic spine with multilevel small disc protrusion causing indentation on the thecal sac with minimal mass effect on the cord from T6-7 through T8-9. 2. No high-grade spinal canal or neural foraminal stenosis at any level of the thoracic spine. 3. Postsurgical changes from laminectomy and left-sided posterior transpedicular fusion at T11-12. 4. Mild degenerative changes of the lumbar spine with mild-to-moderate right neural foraminal narrowing at L2-3. 5. No high-grade spinal canal stenosis in the lumbar spine.     Electronically Signed   By: Baldemar Lenis M.D.   On: 05/04/2022 10:31     Results for orders placed or performed in visit on 03/21/22  TSH  Result Value Ref Range   TSH 2.22 mIU/L  Hemoglobin A1c  Result Value Ref Range   Hgb A1c MFr Bld 6.2 (H) <5.7 % of total Hgb   Mean Plasma Glucose 131 mg/dL   eAG (mmol/L) 7.3 mmol/L  Lipid panel  Result Value Ref Range   Cholesterol 160 <200 mg/dL   HDL 35 (L) > OR = 50 mg/dL   Triglycerides 147 (H) <150 mg/dL   LDL Cholesterol (Calc) 100 (H) mg/dL (calc)   Total CHOL/HDL Ratio 4.6 <5.0 (calc)   Non-HDL Cholesterol (Calc) 125 <130 mg/dL (calc)  CBC  with Differential/Platelet  Result Value Ref Range   WBC 10.5 3.8 - 10.8 Thousand/uL   RBC 5.17 (H) 3.80 - 5.10 Million/uL   Hemoglobin 15.1 11.7 - 15.5 g/dL   HCT 82.9 56.2 - 13.0 %   MCV 86.8 80.0 - 100.0 fL   MCH 29.2 27.0 - 33.0 pg   MCHC 33.6 32.0 - 36.0 g/dL   RDW 86.5 78.4 - 69.6 %   Platelets 257 140 - 400 Thousand/uL   MPV 11.6 7.5 - 12.5 fL   Neutro Abs 7,014 1,500 - 7,800 cells/uL   Lymphs Abs 2,552 850 - 3,900 cells/uL   Absolute Monocytes 483 200 - 950 cells/uL   Eosinophils Absolute 357 15 - 500 cells/uL   Basophils Absolute 95 0 - 200 cells/uL   Neutrophils Relative % 66.8 %   Total Lymphocyte 24.3 %   Monocytes Relative 4.6 %   Eosinophils Relative 3.4 %   Basophils Relative 0.9 %  COMPLETE METABOLIC PANEL WITH GFR  Result Value Ref Range   Glucose, Bld 114 (H) 65 - 99 mg/dL   BUN 8 7 - 25 mg/dL   Creat 2.95 2.84 - 1.32 mg/dL   eGFR 88 > OR = 60 GM/WNU/2.72Z3   BUN/Creatinine Ratio SEE NOTE: 6 - 22 (calc)   Sodium 141 135 - 146 mmol/L   Potassium 4.0 3.5 - 5.3 mmol/L   Chloride 105 98 - 110 mmol/L   CO2 26 20 - 32 mmol/L   Calcium 8.7 8.6 - 10.4 mg/dL   Total Protein 6.8 6.1 - 8.1 g/dL   Albumin 3.9 3.6 - 5.1 g/dL   Globulin 2.9 1.9 - 3.7 g/dL (calc)   AG Ratio 1.3 1.0 - 2.5 (calc)   Total Bilirubin 0.3 0.2 - 1.2 mg/dL   Alkaline phosphatase (APISO) 75 37 - 153 U/L   AST 14 10 - 35 U/L   ALT 19 6 - 29 U/L      Assessment & Plan:   Problem List Items Addressed This Visit     Episodic migraine -  Primary   Other Visit Diagnoses     Cognitive dysfunction       Memory loss due to medical condition       Inattention       Balance disorder           Chronic Neck and Low Back Pain Cervical and Lumbar DDD Cervicogenic Headaches with Migraine Headaches (Episodic migraine) History of Back Surgery   Reviewed past history of spinal conditions She is currently followed by [facility] Neurology for Cervical Spine DDD / Headaches   Current flare up of  low back and neck pain impacting her function overall due to increased pain and triggering migraine headaches.   She has been on Intermittent FMLA, approved through 04/2022. Now due for renewal.   She has done well but still now experiencing flares episodic worse migraines more frequently. Triggered by neck pain and back pain.  Improved Migraine abortive with Ubrelvy 100mg  AS NEEDED. 16 pill count per month  Neurology has her Topamax - which was increased in 02/2022, it may be contributing to her side effects and symptoms of cognitive dysfunction, balance disorder, tremors, hallucinations. It seems more complex migraine headache has worsened her situation.  She has upcoming apt 06/21/22 with [facility] Neurology to discuss further. They will likely anticipate further decrease and discontinue Topamax and may consider further management of her complicated Migraines w/ neurological symptoms.  Additionally, she will see ENT upcoming, has Sleep Study scheduled 07/2022, and lastly Neuro requested Psych eval, it will be scheduled now by [facility], they are in contact with patient. Our team has worked to connect her.  ----------------  Renew Intermittent FMLA 6 months  Intermittent FMLA - continuation  Initial onset 10/2021 Renewal Start 05/25/22 > End 11/25/22  Leave for Treatment Visits 3 visits per 1 month Up to 3 hours per visit  Leave for INTERMITTENT EPISODES / Flares 2 episodes per 1 week Up to 3 days per episode  ---------  Short Term Disability specific dates 6 week coverage Start 06/19/22 > End 08/06/22  Unable to work due to following impact on her work function / job tasks unable to perform:  Difficulty concentrating and focus with tasks and customers Difficulty with talking to customers on phone Organization of speech and thoughts Difficulty with numbness tingling legs, difficulty with prolonged sitting and standing and ambulation Difficulty with typing due to finger numbness  Will  complete paperwork this week and submit via fax and scan to chart.  No orders of the defined types were placed in this encounter.     Follow up plan: Return in about 3 weeks (around 07/10/2022) for 3 week follow-up Short Term Disability updates specialists.   Saralyn Pilar, DO [facility] 06/19/2022, 4:13 PM

## 2022-06-19 NOTE — Patient Instructions (Addendum)
Thank you for coming to the office today.  Goal to taper down Topamax  as instructed by your Neurologist. I do strongly believe that this is causing a lot of side effects.  Keep on the current treatment plan with Qulipta and the migraine / headache treatment  Question thoughts on the "Migraine vs Intracranial Pressure Headache" - referencing the prior MRI.  We will check into status of the Psychiatry referral, I think it is good to get it checked out.  Keep up with ENT  Interrmitent FMLA will work on it this week and the Short Term Disability will be completed when we receive   Please schedule a Follow-up Appointment to: Return in about 3 weeks (around 07/10/2022) for 3 week follow-up Short Term Disability updates specialists.  If you have any other questions or concerns, please feel free to call the office or send a message through MyChart. You may also schedule an earlier appointment if necessary.  Additionally, you may be receiving a survey about your experience at our office within a few days to 1 week by e-mail or mail. We value your feedback.  Saralyn Pilar, DO [facility], [place]

## 2022-06-20 ENCOUNTER — Encounter: Payer: Self-pay | Admitting: Family Medicine

## 2022-06-21 DIAGNOSIS — R5383 Other fatigue: Secondary | ICD-10-CM | POA: Diagnosis not present

## 2022-06-21 DIAGNOSIS — R200 Anesthesia of skin: Secondary | ICD-10-CM | POA: Diagnosis not present

## 2022-06-21 DIAGNOSIS — E538 Deficiency of other specified B group vitamins: Secondary | ICD-10-CM | POA: Diagnosis not present

## 2022-06-21 DIAGNOSIS — G43009 Migraine without aura, not intractable, without status migrainosus: Secondary | ICD-10-CM | POA: Diagnosis not present

## 2022-06-22 DIAGNOSIS — H9313 Tinnitus, bilateral: Secondary | ICD-10-CM | POA: Diagnosis not present

## 2022-06-22 DIAGNOSIS — H903 Sensorineural hearing loss, bilateral: Secondary | ICD-10-CM | POA: Diagnosis not present

## 2022-06-28 ENCOUNTER — Other Ambulatory Visit: Payer: Self-pay | Admitting: Family Medicine

## 2022-06-28 DIAGNOSIS — F331 Major depressive disorder, recurrent, moderate: Secondary | ICD-10-CM

## 2022-06-28 DIAGNOSIS — M797 Fibromyalgia: Secondary | ICD-10-CM

## 2022-06-28 NOTE — Telephone Encounter (Signed)
Requested Prescriptions  Pending Prescriptions Disp Refills   DULoxetine (CYMBALTA) 30 MG capsule [Pharmacy Med Name: DULOXETINE DR 30MG  CAPSULES] 90 capsule 0    Sig: TAKE 1 CAPSULE(30 MG) BY MOUTH DAILY     Psychiatry: Antidepressants - SNRI - duloxetine Passed - 06/28/2022  3:17 AM      Passed - Cr in normal range and within 360 days    Creat  Date Value Ref Range Status  03/22/2022 0.80 0.50 - 1.03 mg/dL Final         Passed - eGFR is 30 or above and within 360 days    GFR, Estimated  Date Value Ref Range Status  11/09/2020 >60 >60 mL/min Final    Comment:    (NOTE) Calculated using the CKD-EPI Creatinine Equation (2021)    eGFR  Date Value Ref Range Status  03/22/2022 88 > OR = 60 mL/min/1.20m2 Final         Passed - Completed PHQ-2 or PHQ-9 in the last 360 days      Passed - Last BP in normal range    BP Readings from Last 1 Encounters:  06/19/22 115/74         Passed - Valid encounter within last 6 months    Recent Outpatient Visits           1 week ago Episodic migraine   [place] [facility] Smitty Cords, DO   2 months ago Episodic migraine   [place] [facility] [place], Netta Neat, DO   2 months ago Annual physical exam   [place] [facility] Smitty Cords, DO   3 months ago Influenza A   [place] [facility] Smitty Cords, DO   4 months ago Influenza A   [place] [facility] [place], Salvadore Oxford, NP       Future Appointments             In 1 week Althea Charon, Netta Neat, DO [place] [facility]   In 1 month Althea Charon, Netta Neat, DO [place] [facility]

## 2022-07-10 ENCOUNTER — Encounter: Payer: Self-pay | Admitting: Family Medicine

## 2022-07-10 ENCOUNTER — Ambulatory Visit: Payer: BC Managed Care – PPO | Admitting: Family Medicine

## 2022-07-10 VITALS — BP 110/70 | HR 89 | Ht <= 58 in | Wt 186.0 lb

## 2022-07-10 DIAGNOSIS — M4802 Spinal stenosis, cervical region: Secondary | ICD-10-CM

## 2022-07-10 DIAGNOSIS — M5030 Other cervical disc degeneration, unspecified cervical region: Secondary | ICD-10-CM

## 2022-07-10 DIAGNOSIS — F09 Unspecified mental disorder due to known physiological condition: Secondary | ICD-10-CM | POA: Diagnosis not present

## 2022-07-10 DIAGNOSIS — G43909 Migraine, unspecified, not intractable, without status migrainosus: Secondary | ICD-10-CM

## 2022-07-10 DIAGNOSIS — R413 Other amnesia: Secondary | ICD-10-CM | POA: Diagnosis not present

## 2022-07-10 DIAGNOSIS — G4486 Cervicogenic headache: Secondary | ICD-10-CM

## 2022-07-10 DIAGNOSIS — R2689 Other abnormalities of gait and mobility: Secondary | ICD-10-CM | POA: Diagnosis not present

## 2022-07-10 NOTE — Progress Notes (Signed)
Subjective:    Patient ID: Megan Hawkins, female    DOB: 09-Feb-1971, 52 y.o.   MRN: [id number]  Megan Hawkins is a 52 y.o. female presenting on 07/10/2022 for Migraine   HPI  Episodic Migraine Headaches Memory Loss, mid conversation Balance Disorder Hallucinations   Cervical Spine DDD / Chronic Neck Pain, spinal stenosis. Chronic Low Back Pain  Updates since last visit 06/21/22 She has come off of Topamax completely, still has headaches. She continues on Qulipta ENT was concerned about pseudotumor cerebri, still has vertigo, still has dizzy spells Waiting on referral to [facility]  Next new patient for July 2024 for Psychiatry  Reports constellation of symptoms persistent headaches recently, managed by Neurology at [facility] clinic   Reports difficulty with imbalance and balance now, walks with a cane at times and loses balance if lean backwards She admits some tremors  On Qulipta 60mg  daily On Ubrelvy AS NEEDED for migraines  Continues on Gabapentin, Flexeril AS NEEDED   She is improved on migraines with Bernita Raisin abortive therapy   Vitamin D and B12 was low deficiency Treated by Neuro right now, B12 shots and supplements   Has seen ENT for vertigo  Sleep Study scheduled for June 2024   Stress worsens symptoms May have side effects on Topamax   She has chronic pain and continues on Duloxetine, Gabapentin, Flexeril.       06/19/2022    4:12 PM 04/26/2022    9:53 AM 04/03/2022    8:47 AM  Depression screen PHQ 2/9  Decreased Interest 1 1 3   Down, Depressed, Hopeless 1 2 3   PHQ - 2 Score 2 3 6   Altered sleeping 1 2 3   Tired, decreased energy 1 2 3   Change in appetite 0 0 3  Feeling bad or failure about yourself  0 1 3  Trouble concentrating 3 1 3   Moving slowly or fidgety/restless 0 2 3  Suicidal thoughts 0 0 0  PHQ-9 Score 7 11 24   Difficult doing work/chores  Somewhat difficult Extremely dIfficult    Social History   Tobacco Use    Smoking status: Every Day    Packs/day: .5    Types: Cigarettes    Passive exposure: Past   Smokeless tobacco: Never  Vaping Use   Vaping Use: Never used  Substance Use Topics   Alcohol use: Not Currently   Drug use: Never    Review of Systems Per HPI unless specifically indicated above     Objective:    BP 110/70   Pulse 89   Ht 4\' 10"  (1.473 m)   Wt 186 lb (84.4 kg)   SpO2 97%   BMI 38.87 kg/m   Wt Readings from Last 3 Encounters:  07/10/22 186 lb (84.4 kg)  06/19/22 192 lb (87.1 kg)  04/26/22 191 lb 3.2 oz (86.7 kg)    Physical Exam Vitals and nursing note reviewed.  Constitutional:      General: She is not in acute distress.    Appearance: She is well-developed. She is not diaphoretic.     Comments: Well-appearing, comfortable, cooperative  HENT:     Head: Normocephalic and atraumatic.  Eyes:     General:        Right eye: No discharge.        Left eye: No discharge.     Conjunctiva/sclera: Conjunctivae normal.  Neck:     Thyroid: No thyromegaly.  Cardiovascular:     Rate and Rhythm: Normal rate and  regular rhythm.     Heart sounds: Normal heart sounds. No murmur heard. Pulmonary:     Effort: Pulmonary effort is normal. No respiratory distress.     Breath sounds: Normal breath sounds. No wheezing or rales.  Musculoskeletal:     Cervical back: Neck supple.     Comments: Some reduced range of motion with neck and low back.  Lymphadenopathy:     Cervical: No cervical adenopathy.  Skin:    General: Skin is warm and dry.     Findings: No erythema or rash.  Neurological:     Mental Status: She is alert and oriented to person, place, and time.  Psychiatric:        Behavior: Behavior normal.     Comments: Well groomed, good eye contact. Occasional difficulty with word finding or losing train of thought in conversation. Conversational short term recall is limited.       Results for orders placed or performed in visit on 03/21/22  TSH  Result Value Ref  Range   TSH 2.22 mIU/L  Hemoglobin A1c  Result Value Ref Range   Hgb A1c MFr Bld 6.2 (H) <5.7 % of total Hgb   Mean Plasma Glucose 131 mg/dL   eAG (mmol/L) 7.3 mmol/L  Lipid panel  Result Value Ref Range   Cholesterol 160 <200 mg/dL   HDL 35 (L) > OR = 50 mg/dL   Triglycerides 161 (H) <150 mg/dL   LDL Cholesterol (Calc) 100 (H) mg/dL (calc)   Total CHOL/HDL Ratio 4.6 <5.0 (calc)   Non-HDL Cholesterol (Calc) 125 <130 mg/dL (calc)  CBC with Differential/Platelet  Result Value Ref Range   WBC 10.5 3.8 - 10.8 Thousand/uL   RBC 5.17 (H) 3.80 - 5.10 Million/uL   Hemoglobin 15.1 11.7 - 15.5 g/dL   HCT 09.6 04.5 - 40.9 %   MCV 86.8 80.0 - 100.0 fL   MCH 29.2 27.0 - 33.0 pg   MCHC 33.6 32.0 - 36.0 g/dL   RDW 81.1 91.4 - 78.2 %   Platelets 257 140 - 400 Thousand/uL   MPV 11.6 7.5 - 12.5 fL   Neutro Abs 7,014 1,500 - 7,800 cells/uL   Lymphs Abs 2,552 850 - 3,900 cells/uL   Absolute Monocytes 483 200 - 950 cells/uL   Eosinophils Absolute 357 15 - 500 cells/uL   Basophils Absolute 95 0 - 200 cells/uL   Neutrophils Relative % 66.8 %   Total Lymphocyte 24.3 %   Monocytes Relative 4.6 %   Eosinophils Relative 3.4 %   Basophils Relative 0.9 %  COMPLETE METABOLIC PANEL WITH GFR  Result Value Ref Range   Glucose, Bld 114 (H) 65 - 99 mg/dL   BUN 8 7 - 25 mg/dL   Creat 9.56 2.13 - 0.86 mg/dL   eGFR 88 > OR = 60 VH/QIO/9.62X5   BUN/Creatinine Ratio SEE NOTE: 6 - 22 (calc)   Sodium 141 135 - 146 mmol/L   Potassium 4.0 3.5 - 5.3 mmol/L   Chloride 105 98 - 110 mmol/L   CO2 26 20 - 32 mmol/L   Calcium 8.7 8.6 - 10.4 mg/dL   Total Protein 6.8 6.1 - 8.1 g/dL   Albumin 3.9 3.6 - 5.1 g/dL   Globulin 2.9 1.9 - 3.7 g/dL (calc)   AG Ratio 1.3 1.0 - 2.5 (calc)   Total Bilirubin 0.3 0.2 - 1.2 mg/dL   Alkaline phosphatase (APISO) 75 37 - 153 U/L   AST 14 10 - 35 U/L   ALT  19 6 - 29 U/L      Assessment & Plan:   Problem List Items Addressed This Visit     Cervical spinal stenosis   DDD  (degenerative disc disease), cervical   Episodic migraine - Primary   Relevant Medications   QULIPTA 60 MG TABS   Other Visit Diagnoses     Cognitive dysfunction       Memory loss due to medical condition       Balance disorder       Cervicogenic headache       Relevant Medications   QULIPTA 60 MG TABS       Chronic Neck and Low Back Pain Cervical and Lumbar DDD Cervicogenic Headaches with Migraine Headaches (Episodic migraine) History of Back Surgery   Reviewed past history of spinal conditions She is currently followed by [facility] for Cervical Spine DDD / Headaches   Current flare up of low back and neck pain impacting her function overall due to increased pain and triggering migraine headaches.   She has been on Intermittent FMLA, last submitted paperwork 05/2022   She has done well but still now experiencing flares episodic worse migraines more frequently. Triggered by neck pain and back pain.  She continues on Qulipta 60mg  daily for migraine prophylaxis She is OFF Topamax (Topiramate) due to side effect On Ubrelvy as AS NEEDED abortive   Still has issues with memory and brain fog following discontinuation of Topamax   Additionally, she has Sleep Study scheduled 07/2022, and lastly Neuro requested Psych eval, it will be scheduled now by [facility], they are in contact with patient. Our team has worked to connect her.   Upcoming Sleep Assessemtn 6/12  Please call [facility] for further management to schedule. I do not see it scheduled yet.  I will talk to [facility] to clarify the situation with diagnosis. Neurology managing headaches, but advised unlikely Idiopathic Intracranial Hypertension  ----------------   Renew Intermittent FMLA 6 months   Intermittent FMLA - continuation   Initial onset 10/2021 Renewal Start 05/25/22 > End 11/25/22   Leave for Treatment Visits 3 visits per 1 month Up to 3 hours per visit   Leave for  INTERMITTENT EPISODES / Flares 2 episodes per 1 week Up to 3 days per episode   ---------   Short Term Disability Start 06/19/22 > End 08/28/22   Unable to work due to following impact on her work function / job tasks unable to perform:   Difficulty concentrating and focus with tasks and customers Difficulty with talking to customers on phone Organization of speech and thoughts Difficulty with numbness tingling legs, difficulty with prolonged sitting and standing and ambulation Difficulty with typing due to finger numbness    No orders of the defined types were placed in this encounter.     Follow up plan: Return in about 5 weeks (around 08/14/2022) for 5 weeks follow-up Headaches / Disability paper work.   Saralyn Pilar, DO [facility] 07/10/2022, 3:40 PM

## 2022-07-10 NOTE — Patient Instructions (Addendum)
Thank you for coming to the office today.  Keep on current medication   Upcoming Sleep Assessemtn 6/12  Please call [facility] for further management to schedule. I do not see it scheduled yet.  I will talk to [facility] to clarify the situation.   Please schedule a Follow-up Appointment to: Return in about 5 weeks (around 08/14/2022) for 5 weeks follow-up Headaches / Disability paper work.  If you have any other questions or concerns, please feel free to call the office or send a message through MyChart. You may also schedule an earlier appointment if necessary.  Additionally, you may be receiving a survey about your experience at our office within a few days to 1 week by e-mail or mail. We value your feedback.  Saralyn Pilar, DO [facility], [place]

## 2022-07-26 DIAGNOSIS — G43711 Chronic migraine without aura, intractable, with status migrainosus: Secondary | ICD-10-CM | POA: Diagnosis not present

## 2022-08-02 ENCOUNTER — Ambulatory Visit (INDEPENDENT_AMBULATORY_CARE_PROVIDER_SITE_OTHER): Payer: BC Managed Care – PPO | Admitting: Family Medicine

## 2022-08-02 ENCOUNTER — Encounter: Payer: Self-pay | Admitting: Family Medicine

## 2022-08-02 VITALS — BP 98/72 | HR 85 | Temp 98.7°F | Resp 18 | Ht <= 58 in | Wt 187.0 lb

## 2022-08-02 DIAGNOSIS — G43719 Chronic migraine without aura, intractable, without status migrainosus: Secondary | ICD-10-CM

## 2022-08-02 DIAGNOSIS — G4486 Cervicogenic headache: Secondary | ICD-10-CM

## 2022-08-02 DIAGNOSIS — R7303 Prediabetes: Secondary | ICD-10-CM

## 2022-08-02 LAB — POCT GLYCOSYLATED HEMOGLOBIN (HGB A1C): Hemoglobin A1C: 5.8 % — AB (ref 4.0–5.6)

## 2022-08-02 MED ORDER — CONTRAVE 8-90 MG PO TB12: 2.0000 | ORAL_TABLET | Freq: Two times a day (BID) | ORAL | 2 refills | Status: DC

## 2022-08-02 NOTE — Patient Instructions (Addendum)
Thank you for coming to the office today.  Will complete the Short Term Disability update With ESTIMATED Return to Work Fully duty date of 09/28/22  Keep on track with Neurology, in July locally and Sept [facility].  Recent Labs    03/22/22 0807 08/02/22 1005  HGBA1C 6.2* 5.8*   Improved sugar. Great work! Keep it up.  Visit Details  Encounter Start Encounter End  09/20/2022 11:00 AM 09/20/2022 11:15 AM   Providers  Gelene Mink, PA Neurology NPI: [id number] [address] [place] [address]   --------------------------------------------- Visit Details  Encounter Start Encounter End  11/27/2022 11:00 AM 11/27/2022 11:30 AM   Providers  Evelena Peat, [place] Neurology NPI: [id number] [address] [place] [address]   Phone: [phone] Fax: [phone]   Please schedule a Follow-up Appointment to: Return in about 6 weeks (around 09/13/2022) for 6 week follow-up Migraines, Short Term Disability, return to work 8/1.  If you have any other questions or concerns, please feel free to call the office or send a message through MyChart. You may also schedule an earlier appointment if necessary.  Additionally, you may be receiving a survey about your experience at our office within a few days to 1 week by e-mail or mail. We value your feedback.  Saralyn Pilar, DO [place], [place]

## 2022-08-02 NOTE — Progress Notes (Signed)
Subjective:    Patient ID: Megan Hawkins, female    DOB: 04-Mar-1970, 52 y.o.   MRN: [id number]  Megan Hawkins is a 52 y.o. female presenting on 08/02/2022 for Migraine and Pre Diabetes   HPI  Chronic Intractable Migraine Headaches Secondary Memory Loss and Balance problem   Cervical Spine DDD / Chronic Neck Pain, spinal stenosis. Chronic Low Back Pain  Updates in interval since last visit 07/10/22  Established with Dr Thomasena Edis at [facility] Diagnosed confirmed migraines, not pseudotumor He will plan to change from [facility] over to injectable Emgality trial She has upcoming apt [facility] 7/24 Nilda Calamity PA Upcoming 11/27/22 at [facility] again  Update on Short Term Disability, will need renewal. She is not ready to work, and still awaiting further migraine medication management.   Anticipated Return to work estimated 09/28/22. Will need extension beyond the previous 08/28/22 date.  Short Term Disability paperwork update today  Next new patient for July 2024 for Psychiatry   On Qulipta 60mg  daily - awaiting switch to Emgality injection On Ubrelvy AS NEEDED for migraines  Continues on Gabapentin, Flexeril AS NEEDED   She is improved on migraines with Bernita Raisin abortive therapy   Vitamin D and B12 was low deficiency Treated by Neuro right now, B12 shots and supplements   Has seen ENT for vertigo   Sleep Study scheduled for June 2024   She has chronic pain and continues on Duloxetine, Gabapentin, Flexeril.  Recent Labs    03/22/22 0807 08/02/22 1005  HGBA1C 6.2* 5.8*         08/02/2022    9:26 AM 06/19/2022    4:12 PM 04/26/2022    9:53 AM  Depression screen PHQ 2/9  Decreased Interest 3 1 1   Down, Depressed, Hopeless 2 1 2   PHQ - 2 Score 5 2 3   Altered sleeping 2 1 2   Tired, decreased energy 2 1 2   Change in appetite 0 0 0  Feeling bad or failure about yourself  0 0 1  Trouble concentrating 3 3 1   Moving slowly or fidgety/restless 3 0 2   Suicidal thoughts 0 0 0  PHQ-9 Score 15 7 11   Difficult doing work/chores Extremely dIfficult  Somewhat difficult      08/02/2022    9:27 AM 06/19/2022    4:12 PM 04/26/2022    9:53 AM 04/03/2022    8:47 AM  GAD 7 : Generalized Anxiety Score  Nervous, Anxious, on Edge 2 0 0 3  Control/stop worrying 0 0 1 3  Worry too much - different things 2 0 1 3  Trouble relaxing 0 0 0 3  Restless 0 0 0 0  Easily annoyed or irritable 3 0 1 2  Afraid - awful might happen 0 0 0 0  Total GAD 7 Score 7 0 3 14  Anxiety Difficulty Not difficult at all  Somewhat difficult Somewhat difficult      Social History   Tobacco Use   Smoking status: Every Day    Packs/day: .5    Types: Cigarettes    Passive exposure: Past   Smokeless tobacco: Never  Vaping Use   Vaping Use: Never used  Substance Use Topics   Alcohol use: Not Currently   Drug use: Never    Review of Systems Per HPI unless specifically indicated above     Objective:    BP 98/72 (BP Location: Right Arm, Patient Position: Sitting, Cuff Size: Normal)   Pulse 85  Temp 98.7 F (37.1 C) (Oral)   Resp 18   Ht 4\' 10"  (1.473 m)   Wt 187 lb (84.8 kg)   SpO2 95%   BMI 39.08 kg/m   Wt Readings from Last 3 Encounters:  08/02/22 187 lb (84.8 kg)  07/10/22 186 lb (84.4 kg)  06/19/22 192 lb (87.1 kg)    Physical Exam Vitals and nursing note reviewed.  Constitutional:      General: She is not in acute distress.    Appearance: She is well-developed. She is not diaphoretic.     Comments: Well-appearing, comfortable, cooperative  HENT:     Head: Normocephalic and atraumatic.  Eyes:     General:        Right eye: No discharge.        Left eye: No discharge.     Conjunctiva/sclera: Conjunctivae normal.  Neck:     Thyroid: No thyromegaly.  Cardiovascular:     Rate and Rhythm: Normal rate and regular rhythm.     Heart sounds: Normal heart sounds. No murmur heard. Pulmonary:     Effort: Pulmonary effort is normal. No respiratory  distress.     Breath sounds: Normal breath sounds. No wheezing or rales.  Musculoskeletal:     Cervical back: Neck supple.     Comments: Some reduced range of motion with neck and low back.  Lymphadenopathy:     Cervical: No cervical adenopathy.  Skin:    General: Skin is warm and dry.     Findings: No erythema or rash.  Neurological:     Mental Status: She is alert and oriented to person, place, and time.  Psychiatric:        Behavior: Behavior normal.     Comments: Well groomed, good eye contact. Occasional difficulty with word finding.    Results for orders placed or performed in visit on 08/02/22  POCT glycosylated hemoglobin (Hb A1C)  Result Value Ref Range   Hemoglobin A1C 5.8 (A) 4.0 - 5.6 %      Assessment & Plan:   Problem List Items Addressed This Visit     Chronic migraine without aura, intractable, without status migrainosus - Primary   Morbid obesity (HCC)   Relevant Medications   CONTRAVE 8-90 MG TB12   Pre-diabetes   Relevant Orders   POCT glycosylated hemoglobin (Hb A1C) (Completed)   Other Visit Diagnoses     Cervicogenic headache            Chronic migraine >14 headache days per month, still present. Now established with [facility] On Qulipta and they will plan to proceed w/ switching over to [facility] injectable preventative therapy Off Sumatriptan has as PRN but not taking Off Nurtec ODT On TCA Nortriptyline preventative  as well  Keep on track with Neurology, in July locally and Sept [place].  Chronic Neck and Low Back Pain Cervical and Lumbar DDD History of Back Surgery   Reviewed past history of spinal conditions She is currently followed by [facility] for Cervical Spine DDD / Headaches   Current flare up of low back and neck pain impacting her function overall due to increased pain and triggering migraine headaches.   Upcoming Sleep Assessement 6/12   ----------------   Short Term Disability Previously Start  06/19/22 > End 08/28/22  Will complete the Short Term Disability renewal today with extension  With ESTIMATED Return to Work Fully duty date of 09/28/22   Unable to work due to following impact on her  work function / job tasks unable to perform:   Difficulty concentrating and focus with tasks and customers Difficulty with talking to customers on phone Organization of speech and thoughts Difficulty with numbness tingling legs, difficulty with prolonged sitting and standing and ambulation Difficulty with typing due to finger numbness   Meds ordered this encounter  Medications   CONTRAVE 8-90 MG TB12    Sig: Take 2 tablets by mouth 2 (two) times daily with a meal.    Dispense:  120 tablet    Refill:  2      Follow up plan: Return in about 6 weeks (around 09/13/2022) for 6 week follow-up Migraines, Short Term Disability, return to work 8/1.   Saralyn Pilar, DO [facility] 08/02/2022, 9:44 AM

## 2022-08-12 DIAGNOSIS — G4733 Obstructive sleep apnea (adult) (pediatric): Secondary | ICD-10-CM | POA: Diagnosis not present

## 2022-08-14 ENCOUNTER — Ambulatory Visit: Payer: BC Managed Care – PPO | Admitting: Family Medicine

## 2022-08-22 ENCOUNTER — Encounter: Payer: Self-pay | Admitting: Family Medicine

## 2022-08-24 ENCOUNTER — Encounter: Payer: Self-pay | Admitting: Family Medicine

## 2022-09-25 ENCOUNTER — Encounter: Payer: Self-pay | Admitting: Family Medicine

## 2022-09-25 ENCOUNTER — Ambulatory Visit: Payer: BC Managed Care – PPO | Admitting: Family Medicine

## 2022-09-25 VITALS — BP 112/70 | HR 78 | Resp 16 | Ht <= 58 in | Wt 186.2 lb

## 2022-09-25 DIAGNOSIS — M4802 Spinal stenosis, cervical region: Secondary | ICD-10-CM

## 2022-09-25 DIAGNOSIS — G4486 Cervicogenic headache: Secondary | ICD-10-CM | POA: Diagnosis not present

## 2022-09-25 DIAGNOSIS — R413 Other amnesia: Secondary | ICD-10-CM

## 2022-09-25 DIAGNOSIS — G43719 Chronic migraine without aura, intractable, without status migrainosus: Secondary | ICD-10-CM | POA: Diagnosis not present

## 2022-09-25 NOTE — Patient Instructions (Addendum)
Thank you for coming to the office today.  Return to work date is set at 09/28/22  Keep up with Dr Thomasena Edis [facility]  Please schedule a Follow-up Appointment to: Return if symptoms worsen or fail to improve.  If you have any other questions or concerns, please feel free to call the office or send a message through MyChart. You may also schedule an earlier appointment if necessary.  Additionally, you may be receiving a survey about your experience at our office within a few days to 1 week by e-mail or mail. We value your feedback.  Saralyn Pilar, DO [facility], [place]

## 2022-09-25 NOTE — Progress Notes (Signed)
Subjective:    Patient ID: Megan Hawkins, female    DOB: 1970/11/18, 52 y.o.   MRN: [id number]  Skii Klawiter is a 52 y.o. female presenting on 09/25/2022 for Follow-up (6 week follow up migraines and short term )   HPI  Chronic Intractable Migraine Headaches Secondary Memory Loss and Balance problem Tremors   Cervical Spine DDD / Chronic Neck Pain, spinal stenosis. Chronic Low Back Pain   Updates in interval since last visit 08/02/22   Established with Dr Thomasena Edis at [facility] Diagnosed confirmed migraines, not pseudotumor On Emgality therapy now for migraine prophylaxis Upcoming 11/27/22 at [facility] again   She is here for return to work evaluation as planned on 09/28/22, Note her prior extension to Short Term Disability paperwork that was submitted was denied. She is in process of appeal.  Anticipated Return to work estimated 09/28/22  Short Term Disability paperwork update today / Return to work Form    Emgality monthly injection On Ubrelvy AS NEEDED for migraines  Continues on Gabapentin, Flexeril AS NEEDED   Improved migraines on therapy. Still has symptoms and complications from migraines among other symptoms with tremors   Vitamin D and B12 was low deficiency Treated by Neuro right now, B12 shots and supplements   Has seen ENT for vertigo   She has chronic pain and continues on Duloxetine, Gabapentin, Flexeril.       09/25/2022    2:34 PM 08/02/2022    9:26 AM 06/19/2022    4:12 PM  Depression screen PHQ 2/9  Decreased Interest 1 3 1   Down, Depressed, Hopeless 0 2 1  PHQ - 2 Score 1 5 2   Altered sleeping 1 2 1   Tired, decreased energy 1 2 1   Change in appetite 0 0 0  Feeling bad or failure about yourself  1 0 0  Trouble concentrating 1 3 3   Moving slowly or fidgety/restless 0 3 0  Suicidal thoughts 0 0 0  PHQ-9 Score 5 15 7   Difficult doing work/chores Somewhat difficult Extremely dIfficult       09/25/2022    2:34 PM 08/02/2022     9:27 AM 06/19/2022    4:12 PM 04/26/2022    9:53 AM  GAD 7 : Generalized Anxiety Score  Nervous, Anxious, on Edge 1 2 0 0  Control/stop worrying 0 0 0 1  Worry too much - different things 2 2 0 1  Trouble relaxing 2 0 0 0  Restless 0 0 0 0  Easily annoyed or irritable 1 3 0 1  Afraid - awful might happen 0 0 0 0  Total GAD 7 Score 6 7 0 3  Anxiety Difficulty Not difficult at all Not difficult at all  Somewhat difficult      Social History   Tobacco Use   Smoking status: Every Day    Current packs/day: 0.50    Types: Cigarettes    Passive exposure: Past   Smokeless tobacco: Never  Vaping Use   Vaping status: Never Used  Substance Use Topics   Alcohol use: Not Currently   Drug use: Never    Review of Systems Per HPI unless specifically indicated above     Objective:    BP 112/70 (BP Location: Right Arm, Patient Position: Sitting, Cuff Size: Normal)   Pulse 78   Resp 16   Ht 4\' 10"  (1.473 m)   Wt 186 lb 3.2 oz (84.5 kg)   SpO2 98%   BMI 38.92  kg/m   Wt Readings from Last 3 Encounters:  09/25/22 186 lb 3.2 oz (84.5 kg)  08/02/22 187 lb (84.8 kg)  07/10/22 186 lb (84.4 kg)    Physical Exam Vitals and nursing note reviewed.  Constitutional:      General: She is not in acute distress.    Appearance: She is well-developed. She is not diaphoretic.  HENT:     Head: Normocephalic and atraumatic.  Eyes:     General:        Right eye: No discharge.        Left eye: No discharge.     Conjunctiva/sclera: Conjunctivae normal.  Neck:     Thyroid: No thyromegaly.  Cardiovascular:     Rate and Rhythm: Normal rate and regular rhythm.  Pulmonary:     Effort: Pulmonary effort is normal. No respiratory distress.     Breath sounds: Normal breath sounds. No wheezing or rales.  Musculoskeletal:        General: Normal range of motion.     Cervical back: Normal range of motion and neck supple.  Lymphadenopathy:     Cervical: No cervical adenopathy.  Skin:    General:  Skin is warm and dry.     Findings: No erythema or rash.  Neurological:     Mental Status: She is alert and oriented to person, place, and time.  Psychiatric:        Behavior: Behavior normal.     Comments: Well groomed, good eye contact, normal speech and thoughts    Results for orders placed or performed in visit on 08/02/22  POCT glycosylated hemoglobin (Hb A1C)  Result Value Ref Range   Hemoglobin A1C 5.8 (A) 4.0 - 5.6 %      Assessment & Plan:   Problem List Items Addressed This Visit     Cervical spinal stenosis   Chronic migraine without aura, intractable, without status migrainosus - Primary   Other Visit Diagnoses     Cervicogenic headache       Memory loss due to medical condition           Chronic migraine >14 headache days per month, still present. Now established with [facility] On Emgality injection therapy for migraine prophylaxis Note there was an issue with pharmacy and authorization, waiting on new order  She still has some breakthrough migraines and concerns. And will continue to follow up with Neurology Her migraines and Neurological symptoms with tremors associated can impact her daily function if flared up.   Chronic Neck and Low Back Pain Cervical and Lumbar DDD History of Back Surgery   Reviewed past history of spinal conditions She is currently followed by [facility] for Cervical Spine DDD / Headaches   Current flare up of low back and neck pain impacting her function overall due to increased pain and triggering migraine headaches.   Short Term Disability Previously Start 06/19/22 > Extended to 09/28/22  She will return to work without restrictions as planned on 09/28/22   While she has been out of work due to these conditions, she was unable to work due to following limitations:  Difficulty concentrating and focus with tasks and customers Difficulty with talking to customers on phone Organization of speech and  thoughts Difficulty with numbness tingling legs, difficulty with prolonged sitting and standing and ambulation Difficulty with typing due to finger numbness  Starting 09/28/22 she is released to return to work full duty, and if any further problems regarding these conditions  arise, she may follow up again with [place] and her Neurologist for further management.   No orders of the defined types were placed in this encounter.   Follow up plan: Return if symptoms worsen or fail to improve.  Saralyn Pilar, DO [facility] 09/25/2022, 2:56 PM

## 2022-10-30 ENCOUNTER — Other Ambulatory Visit: Payer: Self-pay | Admitting: Family Medicine

## 2022-10-30 DIAGNOSIS — M797 Fibromyalgia: Secondary | ICD-10-CM

## 2022-10-30 DIAGNOSIS — F331 Major depressive disorder, recurrent, moderate: Secondary | ICD-10-CM

## 2022-11-08 ENCOUNTER — Ambulatory Visit: Payer: BC Managed Care – PPO | Admitting: Psychiatry

## 2022-11-08 ENCOUNTER — Encounter: Payer: Self-pay | Admitting: Psychiatry

## 2022-11-08 ENCOUNTER — Other Ambulatory Visit
Admission: RE | Admit: 2022-11-08 | Discharge: 2022-11-08 | Disposition: A | Discharge status: Home / Self Care | Payer: BC Managed Care – PPO | Source: Ambulatory Visit | Attending: Psychiatry | Admitting: Psychiatry

## 2022-11-08 VITALS — BP 135/89 | HR 94 | Temp 96.7°F | Ht <= 58 in | Wt 180.4 lb

## 2022-11-08 DIAGNOSIS — F331 Major depressive disorder, recurrent, moderate: Secondary | ICD-10-CM | POA: Diagnosis not present

## 2022-11-08 DIAGNOSIS — F439 Reaction to severe stress, unspecified: Secondary | ICD-10-CM

## 2022-11-08 DIAGNOSIS — F29 Unspecified psychosis not due to a substance or known physiological condition: Secondary | ICD-10-CM | POA: Diagnosis not present

## 2022-11-08 DIAGNOSIS — F419 Anxiety disorder, unspecified: Secondary | ICD-10-CM | POA: Diagnosis not present

## 2022-11-08 DIAGNOSIS — Z9189 Other specified personal risk factors, not elsewhere classified: Secondary | ICD-10-CM

## 2022-11-08 DIAGNOSIS — Z79899 Other long term (current) drug therapy: Secondary | ICD-10-CM | POA: Diagnosis not present

## 2022-11-08 MED ORDER — DULOXETINE HCL 20 MG PO CPEP: 20.0000 mg | ORAL_CAPSULE | Freq: Every day | ORAL | 1 refills | Status: DC

## 2022-11-08 NOTE — Patient Instructions (Addendum)
Please call for EKG - [phone]   [email or web address]  [email or web address]  [email or web address] [facility] [address], [place], [place] [address]   [phone]  [facility] [email or web address] [address], [place], [place] [address]  [phone]  [facility] [email or web address] [address], [place], [place] [address]  [phone]   [facility] - [phone]  [facility] - [phone]  [facility] - [phone] counseling [phone]  [facility] [phone]   [email or web address] this clinician can offer telehealth and has a sliding scale option  [email or web address] this group also offers sliding scale rates and is based out of [place]  Dr. Liborio Nixon with the [facility] specializes in divorce  [employer] [facility] has interns who offer sliding scale rates and some of the full time clinicians do, as well. You complete their contact form on their website and the referrals coordinator will help to get connected to someone   Medicaid below :  [facility] [address] [place], [place] [address]  [phone]    [employer] [address] [place], [place] [address]  [phone]    [facility] [address] [place], [place] [address]  [phone]     Aripiprazole Tablets What is this medication? ARIPIPRAZOLE (ay ri PIP ray zole) treats schizophrenia, bipolar I disorder, autism spectrum disorder, and Tourette disorder. It may also be used with antidepressant medications to treat depression. It works by balancing the levels of dopamine and serotonin in the brain, hormones that help regulate mood,  behaviors, and thoughts. It belongs to a group of medications called antipsychotics. Antipsychotics can be used to treat several kinds of mental health conditions. This medicine may be used for other purposes; ask your health care provider or pharmacist if you have questions. COMMON BRAND NAME(S): Abilify What should I tell my care team before I take this medication? They need to know if you have any of these conditions: Dementia Diabetes Difficulty swallowing Have trouble controlling your muscles Heart disease History of irregular heartbeat History of stroke Low blood cell levels (white cells, red cells, and platelets) Low blood pressure Parkinson disease Seizures Suicidal thoughts, plans, or attempt by you or a family member Urges to engage in impulsive behaviors in ways that are unusual for you An unusual or allergic reaction to aripiprazole, other medications, foods, dyes, or preservatives Pregnant or trying to get pregnant Breastfeeding How should I use this medication? Take this medication by mouth with a glass of water. Take it as directed on the prescription label at the same time every day. You can take it with or without food. If it upsets your stomach, take it with food. Do not take your medication more often than directed. Keep taking it unless your care team tells you to stop. A special MedGuide will be given to you by the pharmacist with each prescription and refill. Be sure to read this information carefully each time. Talk to your care team about the use of this medication in children. While it may be prescribed for children as young as 6 years for selected conditions, precautions do apply. Overdosage: If you think you have taken too much of this medicine contact a poison control center or emergency room at once. NOTE: This medicine is only for you. Do not share  this medicine with others. What if I miss a dose? If you miss a dose, take it as soon as you can. If it is  almost time for your next dose, take only that dose. Do not take double or extra doses. What may interact with this medication? Do not take this medication with any of the following: Brexpiprazole Cisapride Dextromethorphan; quinidine Dronedarone Metoclopramide Pimozide Quinidine Thioridazine This medication may also interact with the following: Antihistamines for allergy, cough, and cold Carbamazepine Certain medications for anxiety or sleep Certain medications for depression, such as amitriptyline, fluoxetine, paroxetine, or sertraline Certain medications for fungal infections, such as fluconazole, itraconazole, ketoconazole, posaconazole, or voriconazole Clarithromycin General anesthetics, such as halothane, isoflurane, methoxyflurane, or propofol Medications for Parkinson disease, such as levodopa Medications for blood pressure Medications for seizures Medications that relax muscles for surgery Opioid medications for pain Other medications that cause heart rhythm changes Phenothiazines, such as chlorpromazine or prochlorperazine Rifampin This list may not describe all possible interactions. Give your health care provider a list of all the medicines, herbs, non-prescription drugs, or dietary supplements you use. Also tell them if you smoke, drink alcohol, or use illegal drugs. Some items may interact with your medicine. What should I watch for while using this medication? Visit your care team for regular checks on your progress. Tell your care team if your symptoms do not start to get better or if they get worse. Do not suddenly stop taking this medication. You may develop a severe reaction. Your care team will tell you how much medication to take. If your care team wants you to stop the medication, the dose may be slowly lowered over time to avoid any side effects. Patients and their families should watch out for new or worsening depression or thoughts of suicide. Also watch out for  sudden changes in feelings such as feeling anxious, agitated, panicky, irritable, hostile, aggressive, impulsive, severely restless, overly excited and hyperactive, or not being able to sleep. If this happens, especially at the beginning of antidepressant treatment or after a change in dose, call your care team. This medication may affect your coordination, reaction time, or judgment. Do not drive or operate machinery until you know how this medication affects you. Sit up or stand slowly to reduce the risk of dizzy or fainting spells. Drinking alcohol with this medication can increase the risk of these side effects. This medication can cause problems with controlling your body temperature. It can lower the response of your body to cold temperatures. If possible, stay indoors during cold weather. If you must go outdoors, wear warm clothes. It can also lower the response of your body to heat. Do not overheat. Do not over-exercise. Stay out of the sun when possible. If you must be in the sun, wear cool clothing. Drink plenty of water. If you have trouble controlling your body temperature, call your care team right away. This medication may cause dry eyes and blurred vision. If you wear contact lenses, you may feel some discomfort. Lubricating eye drops may help. See your care team if the problem does not go away or is severe. This medication may increase blood sugar. Ask your care team if changes in diet or medications are needed if you have diabetes. There have been reports of increased sexual urges or other strong urges such as gambling while taking this medication. If you experience any of these while taking this medication, you should report this to your care team as soon as possible. What side  effects may I notice from receiving this medication? Side effects that you should report to your care team as soon as possible: Allergic reactions--skin rash, itching, hives, swelling of the face, lips, tongue, or  throat High blood sugar (hyperglycemia)--increased thirst or amount of urine, unusual weakness or fatigue, blurry vision High fever, stiff muscles, increased sweating, fast or irregular heartbeat, and confusion, which may be signs of neuroleptic malignant syndrome Low blood pressure--dizziness, feeling faint or lightheaded, blurry vision Pain or trouble swallowing Prolonged or painful erection Seizures Stroke--sudden numbness or weakness of the face, arm, or leg, trouble speaking, confusion, trouble walking, loss of balance or coordination, dizziness, severe headache, change in vision Uncontrolled and repetitive body movements, muscle stiffness or spasms, tremors or shaking, loss of balance or coordination, restlessness, shuffling walk, which may be signs of extrapyramidal symptoms (EPS) Thoughts of suicide or self-harm, worsening mood, feelings of depression Urges to engage in impulsive behaviors such as gambling, binge eating, sexual activity, or shopping in ways that are unusual for you Side effects that usually do not require medical attention (report these to your care team if they continue or are bothersome): Constipation Drowsiness Weight gain This list may not describe all possible side effects. Call your doctor for medical advice about side effects. You may report side effects to FDA at 1-800-FDA-1088. Where should I keep my medication? Keep out of the reach of children and pets. Store at room temperature between 15 and 30 degrees C (59 and 86 degrees F). Throw away any unused medication after the expiration date. NOTE: This sheet is a summary. It may not cover all possible information. If you have questions about this medicine, talk to your doctor, pharmacist, or health care provider.  2024 Elsevier/Gold Standard (2021-09-03 00:00:00)
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
## 2022-11-10 ENCOUNTER — Telehealth: Payer: Self-pay | Admitting: Psychiatry

## 2022-11-10 ENCOUNTER — Ambulatory Visit
Admission: RE | Admit: 2022-11-10 | Discharge: 2022-11-10 | Disposition: A | Discharge status: Home / Self Care | Payer: BC Managed Care – PPO | Source: Ambulatory Visit | Attending: Psychiatry

## 2022-11-10 DIAGNOSIS — F331 Major depressive disorder, recurrent, moderate: Secondary | ICD-10-CM | POA: Insufficient documentation

## 2022-11-10 DIAGNOSIS — Z9189 Other specified personal risk factors, not elsewhere classified: Secondary | ICD-10-CM | POA: Diagnosis not present

## 2022-11-10 NOTE — Telephone Encounter (Signed)
Reviewed EKG resulted 11/10/2022-normal sinus rhythm.  Will await urine drug screen report prior to making medication changes as discussed during her visit.

## 2022-11-12 LAB — URINE DRUGS OF ABUSE SCREEN W ALC, ROUTINE (REF LAB)
Amphetamines, Urine: NEGATIVE ng/mL
Barbiturate, Ur: NEGATIVE ng/mL
Benzodiazepine Quant, Ur: NEGATIVE ng/mL
Cocaine (Metab.): NEGATIVE ng/mL
Ethanol U, Quan: NEGATIVE %
Methadone Screen, Urine: NEGATIVE ng/mL
Opiate Quant, Ur: NEGATIVE ng/mL
Phencyclidine, Ur: NEGATIVE ng/mL
Propoxyphene, Urine: NEGATIVE ng/mL

## 2022-11-12 LAB — PANEL 799049
CARBOXY THC GC/MS CONF: 127 ng/mL
Cannabinoid GC/MS, Ur: POSITIVE — AB

## 2022-11-13 ENCOUNTER — Telehealth: Payer: Self-pay | Admitting: Psychiatry

## 2022-11-13 NOTE — Telephone Encounter (Signed)
Attempted to contact patient to discuss labs, EKG-had to leave a voicemail.

## 2022-11-15 ENCOUNTER — Telehealth: Payer: Self-pay

## 2022-11-15 NOTE — Telephone Encounter (Signed)
pt returning call. she states that he mother had given her some of the delta 8 gummies and she did not know it was that she thought it was a different gummie. please return her call.

## 2022-11-15 NOTE — Telephone Encounter (Signed)
Attempted to return call to patient, patient did not answer, voice mailbox full.  Will have nurse contact patient to discuss that we can send Abilify as discussed, low dosage to pharmacy.

## 2022-11-17 NOTE — Telephone Encounter (Signed)
pt return callback message. pt notified.

## 2022-11-28 ENCOUNTER — Telehealth: Payer: Self-pay | Admitting: Psychiatry

## 2022-11-28 DIAGNOSIS — F331 Major depressive disorder, recurrent, moderate: Secondary | ICD-10-CM

## 2022-11-28 MED ORDER — ARIPIPRAZOLE 2 MG PO TABS: 2.0000 mg | ORAL_TABLET | Freq: Every morning | ORAL | 1 refills | Status: AC

## 2022-11-28 NOTE — Telephone Encounter (Signed)
I have sent Abilify 2 mg to [facility].  Patient to monitor herself and contact us with any concerns.  Patient agrees to pick it up.

## 2022-12-06 ENCOUNTER — Ambulatory Visit: Payer: BC Managed Care – PPO | Admitting: Psychiatry

## 2023-07-16 ENCOUNTER — Emergency Department
Admission: EM | Admit: 2023-07-16 | Discharge: 2023-07-16 | Disposition: A | Discharge status: Home / Self Care | Attending: Emergency Medicine | Admitting: Emergency Medicine

## 2023-07-16 ENCOUNTER — Other Ambulatory Visit: Payer: Self-pay

## 2023-07-16 ENCOUNTER — Encounter: Payer: Self-pay | Admitting: Emergency Medicine

## 2023-07-16 DIAGNOSIS — F419 Anxiety disorder, unspecified: Secondary | ICD-10-CM | POA: Diagnosis not present

## 2023-07-16 DIAGNOSIS — R112 Nausea with vomiting, unspecified: Secondary | ICD-10-CM

## 2023-07-16 LAB — CBC
HCT: 42.6 % (ref 36.0–46.0)
Hemoglobin: 14 g/dL (ref 12.0–15.0)
MCH: 28.6 pg (ref 26.0–34.0)
MCHC: 32.9 g/dL (ref 30.0–36.0)
MCV: 87.1 fL (ref 80.0–100.0)
Platelets: 254 10*3/uL (ref 150–400)
RBC: 4.89 MIL/uL (ref 3.87–5.11)
RDW: 13.3 % (ref 11.5–15.5)
WBC: 8.5 10*3/uL (ref 4.0–10.5)
nRBC: 0 % (ref 0.0–0.2)

## 2023-07-16 LAB — COMPREHENSIVE METABOLIC PANEL WITH GFR
ALT: 17 U/L (ref 0–44)
AST: 17 U/L (ref 15–41)
Albumin: 3.5 g/dL (ref 3.5–5.0)
Alkaline Phosphatase: 66 U/L (ref 38–126)
Anion gap: 6 (ref 5–15)
BUN: 8 mg/dL (ref 6–20)
CO2: 27 mmol/L (ref 22–32)
Calcium: 8.5 mg/dL — ABNORMAL LOW (ref 8.9–10.3)
Chloride: 106 mmol/L (ref 98–111)
Creatinine, Ser: 0.83 mg/dL (ref 0.44–1.00)
GFR, Estimated: >60 mL/min (ref >60)
Glucose, Bld: 96 mg/dL (ref 70–99)
Potassium: 3.5 mmol/L (ref 3.5–5.1)
Sodium: 139 mmol/L (ref 135–145)
Total Bilirubin: 0.5 mg/dL (ref 0.0–1.2)
Total Protein: 6.7 g/dL (ref 6.5–8.1)

## 2023-07-16 LAB — URINALYSIS, ROUTINE W REFLEX MICROSCOPIC
Bilirubin Urine: NEGATIVE
Glucose, UA: NEGATIVE mg/dL
Ketones, ur: NEGATIVE mg/dL
Leukocytes,Ua: NEGATIVE
Nitrite: NEGATIVE
Protein, ur: 100 mg/dL — AB
Specific Gravity, Urine: 1.025 (ref 1.005–1.030)
pH: 5 (ref 5.0–8.0)

## 2023-07-16 LAB — LIPASE, BLOOD: Lipase: 27 U/L (ref 11–51)

## 2023-07-16 MED ORDER — ONDANSETRON HCL 4 MG PO TABS: 4.0000 mg | ORAL_TABLET | Freq: Three times a day (TID) | ORAL | 0 refills | Status: DC | PRN

## 2023-07-16 NOTE — ED Provider Triage Note (Signed)
 Emergency Medicine Provider Triage Evaluation Note  Megan Hawkins , a 53 y.o. female  was evaluated in triage.  Pt complains of constant emesis and nausea since 04/31. No blood. Here to r/o pregnancy because of high emotions. Recently diagnosed with MDD. Does have a psych provider for this. No pain at this time. Sent over from [facility] because of negative pregnancy test.   Review of Systems  Positive:  Negative:   Physical Exam  BP 126/77 (BP Location: Left Arm)   Pulse 71   Temp 98.2 F (36.8 C) (Oral)   Resp 16   Ht 4\' 10"  (1.473 m)   Wt 79.4 kg   SpO2 99%   BMI 36.58 kg/m  Gen:   Awake, no distress   Resp:  Normal effort  MSK:   Moves extremities without difficulty     Medical Decision Making  Medically screening exam initiated at 3:51 PM.  Appropriate orders placed.  Megan Hawkins was informed that the remainder of the evaluation will be completed by another provider, this initial triage assessment does not replace that evaluation, and the importance of remaining in the ED until their evaluation is complete.    Phyllis Breeze, Hedaya Latendresse A, PA-C 07/16/23 [id number]

## 2023-07-16 NOTE — ED Triage Notes (Signed)
 Pt to ED via POV from vomiting. Pt states that she has been vomiting every day since April 30th. Pt states that last night she had severe nausea, dizziness, and felt like she was going to pass out. Pt states that she was evaluated by EMS and they recommended that she come in to be evaluated. Pt states that she has also been very emotional lately and would like a pregnancy test to make sure she is not pregnant.

## 2023-07-16 NOTE — ED Provider Notes (Signed)
 [facility] Provider Note    Event Date/Time   First MD Initiated Contact with Patient 07/16/23 1641     (approximate)   History   Emesis   HPI Sabirin Baray is a 53 y.o. female presenting today for nausea with vomiting.  Patient states she has been very emotional recently crying over simple things which is unusual for her.  She states she has had an ablation in the past but thought this may be related to pregnancy symptoms.  Took 3 home pregnancy test of which 1 was positive.  She felt lightheaded during 1 of these episodes.  She also has a history of anxiety and depression for which she is not on any medication right now and believes this may be contributing to her symptoms.  Otherwise denies chest pain, abdominal pain, shortness of breath, diarrhea, constipation, dysuria.     Physical Exam   Triage Vital Signs: ED Triage Vitals  Encounter Vitals Group     BP 07/16/23 1538 126/77     Systolic BP Percentile --      Diastolic BP Percentile --      Pulse Rate 07/16/23 1538 71     Resp 07/16/23 1538 16     Temp 07/16/23 1538 98.2 F (36.8 C)     Temp Source 07/16/23 1538 Oral     SpO2 07/16/23 1538 99 %     Weight 07/16/23 1540 175 lb (79.4 kg)     Height 07/16/23 1540 4\' 10"  (1.473 m)     Head Circumference --      Peak Flow --      Pain Score 07/16/23 1538 0     Pain Loc --      Pain Education --      Exclude from Growth Chart --     Most recent vital signs: Vitals:   07/16/23 1538  BP: 126/77  Pulse: 71  Resp: 16  Temp: 98.2 F (36.8 C)  SpO2: 99%   Physical Exam: I have reviewed the vital signs and nursing notes. General: Awake, alert, no acute distress.  Nontoxic appearing. Head:  Atraumatic, normocephalic.   ENT:  EOM intact, PERRL. Oral mucosa is pink and moist with no lesions. Neck: Neck is supple with full range of motion, No meningeal signs. Cardiovascular:  RRR, No murmurs. Peripheral pulses palpable and equal  bilaterally. Respiratory:  Symmetrical chest wall expansion.  No rhonchi, rales, or wheezes.  Good air movement throughout.  No use of accessory muscles.   Musculoskeletal:  No cyanosis or edema. Moving extremities with full ROM Abdomen:  Soft, nontender, nondistended. Neuro:  GCS 15, moving all four extremities, interacting appropriately. Speech clear. Psych:  Calm, appropriate.   Skin:  Warm, dry, no rash.    ED Results / Procedures / Treatments   Labs (all labs ordered are listed, but only abnormal results are displayed) Labs Reviewed  COMPREHENSIVE METABOLIC PANEL WITH GFR - Abnormal; Notable for the following components:      Result Value   Calcium 8.5 (*)    All other components within normal limits  URINALYSIS, ROUTINE W REFLEX MICROSCOPIC - Abnormal; Notable for the following components:   Color, Urine YELLOW (*)    APPearance HAZY (*)    Hgb urine dipstick SMALL (*)    Protein, ur 100 (*)    Bacteria, UA RARE (*)    All other components within normal limits  LIPASE, BLOOD  CBC  POC URINE PREG, ED  EKG    RADIOLOGY    PROCEDURES:  Critical Care performed: No  Procedures   MEDICATIONS ORDERED IN ED: Medications - No data to display   IMPRESSION / MDM / ASSESSMENT AND PLAN / ED COURSE  I reviewed the triage vital signs and the nursing notes.                              Differential diagnosis includes, but is not limited to, anxiety, depression, gastritis, reflux, dehydration, UTI  Patient's presentation is most consistent with acute complicated illness / injury requiring diagnostic workup.  Patient is a 53 year old female presenting today for nausea with vomiting and anxiety/depression.  She does note losing her job and not having insurance contributing to a lot of her symptoms.  Vomiting episodes typically happen in the morning or around episodes of high emotional state.  Vital signs are otherwise stable and physical exam unremarkable.  CBC, CMP,  lipase all negative.  UA without signs of infection.  Pregnancy test earlier today at [facility] was negative for pregnancy.  I have a stronger suspicion that this is more related to her anxiety and depression in combination with losing her job and not being on medications.  She now has Medicaid and can be set up to see our psychiatry and primary care providers again.  I have sent her home with as needed nausea medication.  She is agreeable with plan and given strict return precautions.     FINAL CLINICAL IMPRESSION(S) / ED DIAGNOSES   Final diagnoses:  Nausea and vomiting, unspecified vomiting type  Anxiety     Rx / DC Orders   ED Discharge Orders          Ordered    ondansetron  (ZOFRAN ) 4 MG tablet  Every 8 hours PRN        07/16/23 1741             Note:  This document was prepared using Dragon voice recognition software and may include unintentional dictation errors.   Kandee Orion, MD 07/16/23 [phone]

## 2023-07-16 NOTE — Discharge Instructions (Signed)
 Please reach out to your psychiatry and primary care provider teams for ongoing outpatient assessment as this may be related to being off your medications recently from an anxiety and depression standpoint.  I have sent as needed nausea medication to your pharmacy.  Please return for any severe or worsening symptoms.

## 2023-07-19 ENCOUNTER — Telehealth: Payer: Self-pay | Admitting: Psychiatry

## 2023-07-19 NOTE — Telephone Encounter (Signed)
 Noted

## 2024-02-28 ENCOUNTER — Encounter: Payer: Self-pay | Admitting: Emergency Medicine

## 2024-02-28 ENCOUNTER — Emergency Department: Payer: MEDICAID

## 2024-02-28 ENCOUNTER — Other Ambulatory Visit: Payer: Self-pay

## 2024-02-28 ENCOUNTER — Emergency Department
Admission: EM | Admit: 2024-02-28 | Discharge: 2024-02-29 | Disposition: A | Discharge status: Home / Self Care | Payer: MEDICAID

## 2024-02-28 DIAGNOSIS — D72829 Elevated white blood cell count, unspecified: Secondary | ICD-10-CM | POA: Insufficient documentation

## 2024-02-28 DIAGNOSIS — Q211 Atrial septal defect, unspecified: Secondary | ICD-10-CM

## 2024-02-28 DIAGNOSIS — R911 Solitary pulmonary nodule: Secondary | ICD-10-CM

## 2024-02-28 DIAGNOSIS — J9801 Acute bronchospasm: Secondary | ICD-10-CM | POA: Insufficient documentation

## 2024-02-28 DIAGNOSIS — F17200 Nicotine dependence, unspecified, uncomplicated: Secondary | ICD-10-CM | POA: Diagnosis not present

## 2024-02-28 DIAGNOSIS — R0602 Shortness of breath: Secondary | ICD-10-CM | POA: Diagnosis present

## 2024-02-28 LAB — BASIC METABOLIC PANEL WITH GFR
Anion gap: 12 (ref 5–15)
BUN: 14 mg/dL (ref 6–20)
CO2: 26 mmol/L (ref 22–32)
Calcium: 8.2 mg/dL — ABNORMAL LOW (ref 8.9–10.3)
Chloride: 100 mmol/L (ref 98–111)
Creatinine, Ser: 0.84 mg/dL (ref 0.44–1.00)
GFR, Estimated: >60 mL/min
Glucose, Bld: 157 mg/dL — ABNORMAL HIGH (ref 70–99)
Potassium: 3.9 mmol/L (ref 3.5–5.1)
Sodium: 139 mmol/L (ref 135–145)

## 2024-02-28 LAB — CBC
HCT: 46 % (ref 36.0–46.0)
Hemoglobin: 15.4 g/dL — ABNORMAL HIGH (ref 12.0–15.0)
MCH: 29.6 pg (ref 26.0–34.0)
MCHC: 33.5 g/dL (ref 30.0–36.0)
MCV: 88.3 fL (ref 80.0–100.0)
Platelets: 283 K/uL (ref 150–400)
RBC: 5.21 MIL/uL — ABNORMAL HIGH (ref 3.87–5.11)
RDW: 13.3 % (ref 11.5–15.5)
WBC: 13.1 K/uL — ABNORMAL HIGH (ref 4.0–10.5)
nRBC: 0 % (ref 0.0–0.2)

## 2024-02-28 MED ORDER — PREDNISONE 10 MG PO TABS: 20.0000 mg | ORAL_TABLET | Freq: Two times a day (BID) | ORAL | 0 refills | Status: AC

## 2024-02-28 MED ORDER — IPRATROPIUM-ALBUTEROL 0.5-2.5 (3) MG/3ML IN SOLN
3.0000 mL | Freq: Once | RESPIRATORY_TRACT | Status: AC
  Administered 2024-02-28: 3 mL via RESPIRATORY_TRACT
  Filled 2024-02-28: qty 3

## 2024-02-28 MED ORDER — PREDNISONE 20 MG PO TABS
60.0000 mg | ORAL_TABLET | Freq: Once | ORAL | Status: AC
  Administered 2024-02-28: 60 mg via ORAL
  Filled 2024-02-28: qty 3

## 2024-02-28 MED ORDER — IOHEXOL 300 MG/ML  SOLN
75.0000 mL | Freq: Once | INTRAMUSCULAR | Status: AC | PRN
  Administered 2024-02-28: 75 mL via INTRAVENOUS

## 2024-02-28 NOTE — ED Triage Notes (Signed)
 Patient here with shortness of breath for the last couple of weeks. Today it has gotten worse, she is noticing exertionally she is getting so short of breath she can't walk, has to stop frequently to catch her breath and starts wheezing. Has hx of asthma, has tried her inhaler at home with no relief. Denies chest pain, fevers currently. Endorses a cough.

## 2024-02-28 NOTE — ED Provider Notes (Signed)
 "  [facility] Provider Note    Event Date/Time   First MD Initiated Contact with Patient 02/28/24 2145     (approximate)   History   Shortness of Breath   HPI  Megan Hawkins is a 54 y.o. female with a past medical history of ongoing smoking who presents with 1.5 weeks of worsening shortness of breath and cough.  Shortness of breath is worse with exertion.  Denies any chest pain.  She does have an albuterol  inhaler at home but it has not been effective over the past several days.  Denies any fevers or chills.  Has no abdominal pain nausea vomiting or changes in urinary bowel habits.  She presents with her husband who contributes to the history.  She does have a primary care physician that she follows regularly     Physical Exam   Triage Vital Signs: ED Triage Vitals [02/28/24 2133]  Encounter Vitals Group     BP (!) 141/72     Girls Systolic BP Percentile      Girls Diastolic BP Percentile      Boys Systolic BP Percentile      Boys Diastolic BP Percentile      Pulse Rate (!) 110     Resp (!) 24     Temp 98 F (36.7 C)     Temp src      SpO2 98 %     Weight 174 lb 2.6 oz (79 kg)     Height 4' 10 (1.473 m)     Head Circumference      Peak Flow      Pain Score 0     Pain Loc      Pain Education      Exclude from Growth Chart     Most recent vital signs: Vitals:   02/28/24 2206 02/28/24 2216  BP:    Pulse:    Resp:    Temp:    SpO2: 96% 100%    Nursing Triage Note reviewed. Vital signs reviewed and patients oxygen saturation is normoxic  General: Patient is well nourished, well developed, awake and alert, resting comfortably in no acute distress Head: Normocephalic and atraumatic Eyes: Normal inspection, extraocular muscles intact, no conjunctival pallor Ear, nose, throat: Normal external exam Neck: Normal range of motion Respiratory: Patient is moderate respiratory distress, lungs with wheezes throughout Cardiovascular: Patient is  not tachycardic, RRR without murmur appreciated GI: Abd SNT with no guarding or rebound  Back: Normal inspection of the back with good strength and range of motion throughout all ext Extremities: pulses intact with good cap refills, no LE pitting edema or calf tenderness Neuro: The patient is alert and oriented to person, place, and time, appropriately conversive, with 5/5 bilat UE/LE strength, no gross motor or sensory defects noted. Coordination appears to be adequate. Skin: Warm, dry, and intact Psych: normal mood and affect, no SI or HI  ED Results / Procedures / Treatments   Labs (all labs ordered are listed, but only abnormal results are displayed) Labs Reviewed  BASIC METABOLIC PANEL WITH GFR - Abnormal; Notable for the following components:      Result Value   Glucose, Bld 157 (*)    Calcium 8.2 (*)    All other components within normal limits  CBC - Abnormal; Notable for the following components:   WBC 13.1 (*)    RBC 5.21 (*)    Hemoglobin 15.4 (*)    All other components within normal  limits     EKG EKG and rhythm strip are interpreted by myself:   EKG: tachycardic sinus rhythm] at heart rate of 104, normal QRS duration, QTc 444, nonspecific ST segments and T waves no ectopy EKG not consistent with Acute STEMI Rhythm strip: Tachycardic sinus rhythm in lead II in lead II   RADIOLOGY X-ray chest: There is an opacity in the right lung on my independent review interpretation radiologist reads this as 1.5 cm opacity of unclear origin    PROCEDURES:  Critical Care performed: No  Procedures   MEDICATIONS ORDERED IN ED: Medications  ipratropium-albuterol  (DUONEB) 0.5-2.5 (3) MG/3ML nebulizer solution 3 mL (3 mLs Nebulization Given 02/28/24 2216)  ipratropium-albuterol  (DUONEB) 0.5-2.5 (3) MG/3ML nebulizer solution 3 mL (3 mLs Nebulization Given 02/28/24 2213)  predniSONE  (DELTASONE ) tablet 60 mg (60 mg Oral Given 02/28/24 2212)  iohexol  (OMNIPAQUE ) 300 MG/ML solution  75 mL (75 mLs Intravenous Contrast Given 02/28/24 2312)     IMPRESSION / MDM / ASSESSMENT AND PLAN / ED COURSE                                Differential diagnosis includes, but is not limited to, COPD exacerbation, URI, pneumonia, electrolyte derangement, anemia   ED course: Patient is well-appearing and satting well on room air but does have evidence of bronchospasm on exam.  Patient was given 60 mg of prednisone  and 2 DuoNebs in succession with improvement in her symptoms.  She had no profound electrolyte derangements.  She does have a very mild leukocytosis of 13.1.  X-ray was inconclusive for possible pneumonia versus malignancy versus nodule.  Given her leukocytosis would obtain a CT chest with contrast to better elucidate whether antibiotics are indicated Case signed out to oncoming physician pending CT results   Clinical Course as of 02/28/24 2322  Thu Feb 28, 2024  2320 Patient reassessed and feels improved after treatment.  Repeat pulmonary exam demonstrates only very apical recess [HD]  2322 CBC(!) Mild leukocytosis but no profound anemia [HD]  2322 Basic metabolic panel(!) No electrolyte derangements [HD]    Clinical Course User Index [HD] Nicholaus Rolland BRAVO, MD   -- Risk: 5 This patient has a high risk of morbidity due to further diagnostic testing or treatment. Rationale: This patients evaluation and management involve a high risk of morbidity due to the potential severity of presenting symptoms, need for diagnostic testing, and/or initiation of treatment that may require close monitoring. The differential includes conditions with potential for significant deterioration or requiring escalation of care. Treatment decisions in the ED, including medication administration, procedural interventions, or disposition planning, reflect this level of risk. COPA: 5 The patient has the following acute or chronic illness/injury that poses a possible threat to life or bodily function:  [X] : The patient has a potentially serious acute condition or an acute exacerbation of a chronic illness requiring urgent evaluation and management in the Emergency Department. The clinical presentation necessitates immediate consideration of life-threatening or function-threatening diagnoses, even if they are ultimately ruled out.   FINAL CLINICAL IMPRESSION(S) / ED DIAGNOSES   Final diagnoses:  SOB (shortness of breath)  Bronchospasm     Rx / DC Orders   ED Discharge Orders          Ordered    predniSONE  (DELTASONE ) 10 MG tablet  2 times daily with meals        02/28/24 2302  Note:  This document was prepared using Dragon voice recognition software and may include unintentional dictation errors.   Nicholaus Rolland BRAVO, MD 02/28/24 2322  "

## 2024-02-29 NOTE — ED Provider Notes (Signed)
 CT performed patient stable for discharge.  Results suggestive of lung mass suspicious for cancer.  Also question atrial septal defect.  Referrals were placed for lung nodule clinic/pulmonology, hematology/oncology, and cardiology for follow-up of these findings.  Appropriate for discharge.   Cyrena Mylar, MD 02/29/24 [phone]

## 2024-02-29 NOTE — Discharge Instructions (Addendum)
 Take prednisone  for the full 4-day course as prescribed.  There were a few findings in your chest imaging tonight that need follow-up.    There was a lung nodule that was suspicious for cancer.  I have made a referral to both the pulmonology lung clinic and the cancer clinic who will call you to arrange a follow-up appointment for further testing and treatment options as needed.  The CT scan also showed a part of your heart that may represent atrial septal defect but it is unclear.  I have made a referral to the cardiology heart clinic who will call you to schedule a follow-up appointment for any further investigations as needed.  Thank you for choosing us  for your health care today!  Please see your primary doctor this week for a follow up appointment.   If you have any new, worsening, or unexpected symptoms call your doctor right away or come back to the emergency department for reevaluation.  It was my pleasure to care for you today.   Ginnie EDISON Cyrena, MD

## 2024-03-04 ENCOUNTER — Inpatient Hospital Stay: Payer: MEDICAID

## 2024-03-04 ENCOUNTER — Encounter: Payer: Self-pay | Admitting: Oncology

## 2024-03-04 ENCOUNTER — Inpatient Hospital Stay: Payer: MEDICAID | Attending: Oncology | Admitting: Oncology

## 2024-03-04 ENCOUNTER — Encounter: Payer: Self-pay | Admitting: *Deleted

## 2024-03-04 VITALS — BP 109/63 | HR 81 | Temp 97.6°F | Resp 19 | Wt 205.5 lb

## 2024-03-04 DIAGNOSIS — R911 Solitary pulmonary nodule: Secondary | ICD-10-CM | POA: Insufficient documentation

## 2024-03-04 NOTE — Progress Notes (Signed)
 Met with patient and her spouse during initial consult with Dr. Jacobo. All questions answered during visit. Reviewed upcoming appts. Contact info given and instructed to call with any questions or needs. Pt verbalized understanding.

## 2024-03-04 NOTE — Progress Notes (Signed)
 " [facility]  Telephone:([phone] [phone]  ID: Megan Hawkins OB: 04-05-1970  MR#: [id number]  RDW#:[id number]  Patient Care Team: Edman Marsa PARAS, DO as PCP - General (Family Medicine) Verdene Gills, RN as Oncology Nurse Navigator  CHIEF COMPLAINT: Right pulmonary nodule.  INTERVAL HISTORY: Patient is a 54 year old female who was noted to have a suspicious pulmonary nodule on recent CT scan.  She is anxious, but otherwise feels well.  She has no neurologic complaints.  She denies any recent fevers or illnesses.  She has a good appetite and denies weight loss.  She has no chest pain, shortness of breath, cough, or hemoptysis.  She denies any nausea, vomiting, constipation, or diarrhea.  She has no urinary complaints.  Patient offers no further specific complaints today.  REVIEW OF SYSTEMS:   Review of Systems  Constitutional: Negative.  Negative for fever, malaise/fatigue and weight loss.  Respiratory: Negative.  Negative for cough, hemoptysis and shortness of breath.   Cardiovascular: Negative.  Negative for chest pain and leg swelling.  Gastrointestinal: Negative.  Negative for abdominal pain.  Genitourinary: Negative.  Negative for dysuria.  Musculoskeletal: Negative.  Negative for back pain.  Skin: Negative.  Negative for rash.  Neurological: Negative.  Negative for dizziness, focal weakness, weakness and headaches.  Psychiatric/Behavioral:  The patient is nervous/anxious.     As per HPI. Otherwise, a complete review of systems is negative.  PAST MEDICAL HISTORY: Past Medical History:  Diagnosis Date   Allergy    Anxiety    Asthma    BRCA negative 01/2021   MyRisk neg except AXIN2 VUS; IBIS=7.1%/riskscore=12.9%   Colitis    Diverticulitis    Family history of ovarian cancer    Frequent headaches    Seizures (HCC)     PAST SURGICAL HISTORY: Past Surgical History:  Procedure Laterality Date   BACK SURGERY  11/2010   T11-T12,  hardware placement   CESAREAN SECTION     COLONOSCOPY WITH PROPOFOL  N/A 07/07/2021   Procedure: COLONOSCOPY WITH PROPOFOL ;  Surgeon: Therisa Bi, MD;  Location: [facility] ENDOSCOPY;  Service: Gastroenterology;  Laterality: N/A;   LAMINECTOMY AND MICRODISCECTOMY THORACIC SPINE  10/2007   T11-T12   ULNAR NERVE REPAIR  2012    FAMILY HISTORY: Family History  Problem Relation Age of Onset   Alcohol abuse Mother    Schizophrenia Father    Alcohol abuse Father    Heart failure Father    Ovarian cancer Paternal Aunt    Ovarian cancer Paternal Aunt    Drug abuse Daughter    Bipolar disorder Daughter    Personality disorder Daughter    Drug abuse Son     ADVANCED DIRECTIVES (Y/N):  N  HEALTH MAINTENANCE: Social History[1]   Colonoscopy:  PAP:  Bone density:  Lipid panel:  Allergies[2]  Current Outpatient Medications  Medication Sig Dispense Refill   albuterol  (VENTOLIN  HFA) 108 (90 Base) MCG/ACT inhaler Inhale 1-2 puffs into the lungs every 6 (six) hours as needed for wheezing or shortness of breath. 8 g 3   ARIPiprazole  (ABILIFY ) 2 MG tablet Take 1 tablet (2 mg total) by mouth in the morning. 30 tablet 1   cyclobenzaprine  (FLEXERIL ) 10 MG tablet Take 1 tablet (10 mg total) by mouth 3 (three) times daily. 90 tablet 5   DULoxetine  (CYMBALTA ) 30 MG capsule TAKE 1 CAPSULE(30 MG) BY MOUTH DAILY 90 capsule 0   gabapentin  (NEURONTIN ) 400 MG capsule Take 400 mg by mouth 3 (three) times daily.  ibuprofen  (ADVIL ) 800 MG tablet Take 1 tablet (800 mg total) by mouth every 8 (eight) hours as needed for mild pain. 30 tablet 0   UBRELVY  100 MG TABS Take 100 mg by mouth as needed (migraine headache). May repeat 1 dose within 2 hours if headache not resolved or returns. Max dose in 24 hours is 2 tablets. 16 tablet 2   CYANOCOBALAMIN  PO Take by mouth. (Patient not taking: Reported on 03/04/2024)     DULoxetine  (CYMBALTA ) 20 MG capsule Take 1 capsule (20 mg total) by mouth daily. Take along with 30 mg  daily, total of 50 mg (Patient not taking: Reported on 03/04/2024) 30 capsule 1   nortriptyline (PAMELOR) 10 MG capsule Take 20 mg by mouth at bedtime. (Patient not taking: Reported on 03/04/2024)     ondansetron  (ZOFRAN ) 4 MG tablet Take 1 tablet (4 mg total) by mouth every 8 (eight) hours as needed. (Patient not taking: Reported on 03/04/2024) 15 tablet 0   No current facility-administered medications for this visit.    OBJECTIVE: Vitals:   03/04/24 1335  BP: 109/63  Pulse: 81  Resp: 19  Temp: 97.6 F (36.4 C)  SpO2: 98%     Body mass index is 42.95 kg/m.    ECOG FS:0 - Asymptomatic  General: Well-developed, well-nourished, no acute distress. Eyes: Pink conjunctiva, anicteric sclera. HEENT: Normocephalic, moist mucous membranes. Lungs: No audible wheezing or coughing. Heart: Regular rate and rhythm. Abdomen: Soft, nontender, no obvious distention. Musculoskeletal: No edema, cyanosis, or clubbing. Neuro: Alert, answering all questions appropriately. Cranial nerves grossly intact. Skin: No rashes or petechiae noted. Psych: Normal affect. Lymphatics: No cervical, calvicular, axillary or inguinal LAD.   LAB RESULTS:  Lab Results  Component Value Date   NA 139 02/28/2024   K 3.9 02/28/2024   CL 100 02/28/2024   CO2 26 02/28/2024   GLUCOSE 157 (H) 02/28/2024   BUN 14 02/28/2024   CREATININE 0.84 02/28/2024   CALCIUM 8.2 (L) 02/28/2024   PROT 6.7 07/16/2023   ALBUMIN 3.5 07/16/2023   AST 17 07/16/2023   ALT 17 07/16/2023   ALKPHOS 66 07/16/2023   BILITOT 0.5 07/16/2023   GFRNONAA >60 02/28/2024    Lab Results  Component Value Date   WBC 13.1 (H) 02/28/2024   NEUTROABS 7,014 03/22/2022   HGB 15.4 (H) 02/28/2024   HCT 46.0 02/28/2024   MCV 88.3 02/28/2024   PLT 283 02/28/2024     STUDIES: CT Chest W Contrast Result Date: 02/28/2024 EXAM: CT CHEST WITH CONTRAST 02/28/2024 11:17:08 PM TECHNIQUE: CT of the chest was performed with the administration of intravenous  contrast. Multiplanar reformatted images are provided for review. Automated exposure control, iterative reconstruction, and/or weight based adjustment of the mA/kV was utilized to reduce the radiation dose to as low as reasonably achievable. COMPARISON: None available. CLINICAL HISTORY: Abnormal CXR. Dyspnea lipomatous hypertrophy of the interatrial septum. Possible atrial septal defect, not optimally characterized on this examination due to non-gated study. If there is clinical concern, this would be better assessed with echocardiography. FINDINGS: MEDIASTINUM: Lipomatous hypertrophy of the interatrial septum. Possible atrial septal defect, not optimally characterized on this examination due to non-gated study. If there is clinical concern, this would be better assessed with echocardiography. Pericardium is unremarkable. The central airways are clear. LYMPH NODES: Similar pathologically enlarged right paratracheal lymph node is present measuring 12 mm in short axis diameter (series 51, image 2). Nonspecific. Isolated node metastasis is not excluded and this could be further assessed with  a head CT examination. No hilar or axillary lymphadenopathy. LUNGS AND PLEURA: Moderate emphysema. The focal opacity corresponds to a pleural-based microlobulated pulmonary nodule measuring 16 x 16 mm (series 57, image 3) adjacent to a focal area of architectural distortion suspicious for a primary bronchogenic neoplasm such as a scar carcinoma or bronchial adenocarcinoma. Head CT examination is recommended for further evaluation. Mild bronchial wall thickening noted in keeping with airway inflammation. No pleural effusion or pneumothorax. SOFT TISSUES/BONES: T11-T12 posterior thoracic fusion with unilateral pedicle screws on the left and posterior bar. Right T12 hemilaminectomy has been performed. Ankylosis of the left facet joint of T11-T12. Osseous structures are age appropriate. No acute bone abnormality. No lytic or blastic  bone lesion. No acute abnormality of the soft tissues. UPPER ABDOMEN: Limited images of the upper abdomen demonstrates no acute abnormality. IMPRESSION: 1. Pleural-based microlobulated pulmonary nodule measuring 16 x 16 mm adjacent to a focal area of architectural distortion, suspicious for a primary bronchogenic neoplasm such as a scar carcinoma or bronchial adenocarcinoma; consider PET/CT and/or tissue sampling for further evaluation. 2. Enlarged right paratracheal lymph node measuring 12 mm in short axis diameter, nonspecific; nodal metastasis is not excluded, and PET/CT will also be helpful for further evaluation. 3. Moderate emphysema and mild bronchial wall thickening, in keeping with airway inflammation; pulmonary emphysema is an independent risk factor for lung cancer, and consider evaluation for a low-dose CT lung cancer screening program. 4. Lipomatous hypertrophy of the interatrial septum and possible atrial septal defect, not optimally characterized on this examination due to non-gated technique; echocardiography would better assess if there is ongoing concern. 5. Chronic postsurgical changes at T11-12 with posterior fusion hardware, right T12 hemilaminectomy, and ankylosis of the left T11-12 facet joint. Electronically signed by: Dorethia Molt MD 02/28/2024 11:53 PM EST RP Workstation: [id number]   DG Chest 2 View Result Date: 02/28/2024 EXAM: 2 VIEW(S) XRAY OF THE CHEST 02/28/2024 09:58:00 PM COMPARISON: None available. CLINICAL HISTORY: shortness of breath FINDINGS: LUNGS AND PLEURA: There is a new 1.5 cm nodular opacity in right mid lung. No pleural effusion. No pneumothorax. HEART AND MEDIASTINUM: No acute abnormality of the cardiac and mediastinal silhouettes. BONES AND SOFT TISSUES: Fixation hardware in lower thoracic spine. IMPRESSION: 1. 1.5 cm nodular opacity in the right mid lung; recommend chest CT for further evaluation. Electronically signed by: Greig Pique MD 02/28/2024 10:25 PM EST RP  Workstation: [id number]    ASSESSMENT: Right pulmonary nodule.  PLAN:    Right pulmonary nodule: CT scan results from February 28, 2024 reviewed independently and report as above revealing 1.6 x 1.6 cm pleural-based opacity in the right midlung suspicious for underlying malignancy.  Patient also noted to have an enlarged paratracheal lymph node.  She has a PET scan scheduled for Monday, March 10, 2024.  He also has a pulmonary appointment after her PET scan.  Patient will likely require bronchoscopy if PET scan is positive.  Return to clinic 1 week after her bronchoscopy to discuss the results and treatment planning if necessary.  I spent a total of 60 minutes reviewing chart data, face-to-face evaluation with the patient, counseling and coordination of care as detailed above.  Patient expressed understanding and was in agreement with this plan. She also understands that She can call clinic at any time with any questions, concerns, or complaints.    Cancer Staging  No matching staging information was found for the patient.   Evalene JINNY Reusing, MD   03/04/2024 2:28 PM        [  1]  Social History Tobacco Use   Smoking status: Every Day    Current packs/day: 0.50    Types: Cigarettes    Passive exposure: Past   Smokeless tobacco: Never  Vaping Use   Vaping status: Never Used  Substance Use Topics   Alcohol use: Not Currently   Drug use: Never  [2]  Allergies Allergen Reactions   Sulfa Antibiotics Other (See Comments)    Syncope    "

## 2024-03-06 ENCOUNTER — Ambulatory Visit: Payer: MEDICAID | Attending: Internal Medicine | Admitting: Internal Medicine

## 2024-03-06 ENCOUNTER — Encounter: Payer: Self-pay | Admitting: Internal Medicine

## 2024-03-06 VITALS — BP 104/70 | HR 81 | Ht 59.0 in | Wt 202.4 lb

## 2024-03-06 DIAGNOSIS — R000 Tachycardia, unspecified: Secondary | ICD-10-CM | POA: Diagnosis present

## 2024-03-06 DIAGNOSIS — Q211 Atrial septal defect, unspecified: Secondary | ICD-10-CM | POA: Insufficient documentation

## 2024-03-06 DIAGNOSIS — R918 Other nonspecific abnormal finding of lung field: Secondary | ICD-10-CM | POA: Insufficient documentation

## 2024-03-06 DIAGNOSIS — R0602 Shortness of breath: Secondary | ICD-10-CM | POA: Insufficient documentation

## 2024-03-06 DIAGNOSIS — R002 Palpitations: Secondary | ICD-10-CM

## 2024-03-06 HISTORY — DX: Other nonspecific abnormal finding of lung field: R91.8

## 2024-03-06 HISTORY — DX: Tachycardia, unspecified: R00.0

## 2024-03-06 HISTORY — DX: Palpitations: R00.2

## 2024-03-06 HISTORY — DX: Atrial septal defect, unspecified: Q21.10

## 2024-03-06 NOTE — Patient Instructions (Signed)
 Medication Instructions:  Your physician recommends that you continue on your current medications as directed. Please refer to the Current Medication list given to you today.    *If you need a refill on your cardiac medications before your next appointment, please call your pharmacy*  Lab Work: No labs ordered today    Testing/Procedures: Your physician has requested that you have an echocardiogram. Echocardiography is a painless test that uses sound waves to create images of your heart. It provides your doctor with information about the size and shape of your heart and how well your hearts chambers and valves are working.   You may receive an ultrasound enhancing agent through an IV if needed to better visualize your heart during the echo. This procedure takes approximately one hour.  There are no restrictions for this procedure.  This will take place at [address] [facility] [address], [place] [address]  Please note: We ask at that you not bring children with you during ultrasound (echo/ vascular) testing. Due to room size and safety concerns, children are not allowed in the ultrasound rooms during exams. Our front office staff cannot provide observation of children in our lobby area while testing is being conducted. An adult accompanying a patient to their appointment will only be allowed in the ultrasound room at the discretion of the ultrasound technician under special circumstances. We apologize for any inconvenience.   Follow-Up: At [facility], you and your health needs are our priority.  As part of our continuing mission to provide you with exceptional heart care, our providers are all part of one team.  This team includes your primary Cardiologist (physician) and Advanced Practice Providers or APPs (Physician Assistants and Nurse Practitioners) who all work together to provide you with the care you need, when you need it.  Your next appointment:   1  month(s)  Provider:   You may see Lonni Hanson, MD or one of the following Advanced Practice Providers on your designated Care Team:   Lonni Meager, NP Lesley Maffucci, PA-C Bernardino Bring, PA-C Cadence [place], PA-C Tylene Lunch, NP Barnie Hila, NP

## 2024-03-06 NOTE — Progress Notes (Signed)
 " Cardiology Office Note:  .   Date:  03/06/2024  ID:  Karna Sprung, DOB [birth year], MRN [id number] PCP: Edman Marsa PARAS, DO  [facility] Providers Cardiologist:  None     History of Present Illness: .   Vincy Feliz is a 54 y.o. female with history of asthma, childhood seizures, frequent headaches, diverticulitis, degenerative disk disease, and tobacco use (she quit 4 days ago), who has been referred for evaluation of possible atrial septal defect noted on CT of the chest.  She presented to the [facility] emergency department on 02/28/2024 with worsening shortness of breath and cough over the course of ~3 weeks.  Her husband notes that she sounded wheezy and was struggling to breathe.  She was noted to be mildly tachycardic (sinus tachycardia).  She underwent CT of the chest for evaluation of lung opacity on chest x-ray; scan was concerning for pleural-based microlobulated pulmonary nodule measuring 1.6 x 1.6 cm in the right lung concerning for malignancy.  Moderate emphysematous changes were noted as well as lipomatous hypertrophy of the interatrial septum as well as possible ASD.  Ms. Amini is feeling better with improvement in her shortness of breath.  She has been referred to PET/CT and pulmonary consultation after seeing Dr. Jacobo in the cancer center.  She notes sporadic palpitations and chest pain in the past.  She mentions an urgent care visit ~2 years ago for these symptoms; she was referred to the ED because she was told that she may be having a heart attack.  It does not appear that any cardiac testing was performed at that time.  She has not had any recent chest pain but still has occasional skipped beats that can bring on transient tightness in her chest.  She denies lightheadedness and edema.  Ms. Tenorio reports that she was told many years ago that she may have had a TIA after transiently slurring her words in the setting of a severe headache.  ROS: See HPI  Studies  Reviewed: SABRA   EKG Interpretation Date/Time:  Thursday March 06 2024 09:38:39 EST Ventricular Rate:  81 PR Interval:  126 QRS Duration:  74 QT Interval:  392 QTC Calculation: 455 R Axis:   43  Text Interpretation: Normal sinus rhythm Normal ECG When compared with ECG of 28-Feb-2024 21:43, HEART RATE has decreased Confirmed by Jonea Bukowski [phone]) on 03/06/2024 9:41:01 AM    Risk Assessment/Calculations:             Physical Exam:   VS:  BP 104/70 (BP Location: Right Arm, Patient Position: Sitting, Cuff Size: Large)   Pulse 81   Ht 4' 11 (1.499 m)   Wt 202 lb 6 oz (91.8 kg)   SpO2 94%   BMI 40.87 kg/m    Wt Readings from Last 3 Encounters:  03/06/24 202 lb 6 oz (91.8 kg)  03/04/24 205 lb 8 oz (93.2 kg)  02/28/24 174 lb 2.6 oz (79 kg)    General:  NAD.  Accompanied by her husband. Neck: No JVD or HJR. Lungs: Clear to auscultation bilaterally without wheezes or crackles. Heart: Regular rate and rhythm without murmurs, rubs, or gallops. Abdomen: Soft, nontender, nondistended. Extremities: No lower extremity edema.  ASSESSMENT AND PLAN: .    Shortness of breath and possible ASD: Shortness of breath most likely related to COPD and recently discovered lung mass that is being evaluated by oncology and pulmonology.  I have personally reviewed her CT of the chest from 02/28/2024; there is increased  fatty tissue encroaching on the right atrium; the appearance is more suspicious for a lipoma than lipomatous hypertrophy of the interatrial septum.  Portions of the septum are not clearly visualized, raising the possibility of an ASD.  We have agreed to evaluate this further with an echocardiogram with bubble study.  Based on the results and symptoms going forward, further evaluation with gated cardiac CTA and/or cardiac MRI will need to be considered.  Of note, I do not appreciate any significant coronary artery calcification on Ms. Nappi recent ungated chest CT.  Sinus tachycardia and  palpitations: Noted during recent ED visit for shortness of breath.  I suspect his was due to physiologic stress from her shortness of breath.  Her heart rate is normal today.  We will consider ambulatory cardiac monitoring after completion of her echocardiogram +/- CTA/MRI.  Lung mass: Suspicious for bronchogenic carcinoma.  Continue workup per oncology and pulmonology, including upcoming PET/CT.  Of note, Ms. Trimarco quit smoking 4 days ago.    Dispo: Return to clinic in 1 month.  Signed, Lonni Hanson, MD  "

## 2024-03-10 ENCOUNTER — Ambulatory Visit
Admission: RE | Admit: 2024-03-10 | Discharge: 2024-03-10 | Disposition: A | Discharge status: Home / Self Care | Payer: MEDICAID | Source: Ambulatory Visit | Attending: Oncology | Admitting: Oncology

## 2024-03-10 DIAGNOSIS — R590 Localized enlarged lymph nodes: Secondary | ICD-10-CM | POA: Diagnosis not present

## 2024-03-10 DIAGNOSIS — I7091 Generalized atherosclerosis: Secondary | ICD-10-CM | POA: Diagnosis not present

## 2024-03-10 DIAGNOSIS — R911 Solitary pulmonary nodule: Secondary | ICD-10-CM | POA: Insufficient documentation

## 2024-03-10 DIAGNOSIS — Z9889 Other specified postprocedural states: Secondary | ICD-10-CM | POA: Diagnosis not present

## 2024-03-10 LAB — GLUCOSE, CAPILLARY: Glucose-Capillary: 94 mg/dL (ref 70–99)

## 2024-03-10 MED ORDER — FLUDEOXYGLUCOSE F - 18 (FDG) INJECTION
10.5000 | Freq: Once | INTRAVENOUS | Status: AC | PRN
  Administered 2024-03-10: 10.85 via INTRAVENOUS

## 2024-03-12 ENCOUNTER — Ambulatory Visit (INDEPENDENT_AMBULATORY_CARE_PROVIDER_SITE_OTHER): Payer: MEDICAID | Admitting: Student in an Organized Health Care Education/Training Program

## 2024-03-12 ENCOUNTER — Encounter: Payer: Self-pay | Admitting: Student in an Organized Health Care Education/Training Program

## 2024-03-12 ENCOUNTER — Telehealth: Payer: Self-pay

## 2024-03-12 VITALS — BP 122/82 | HR 87 | Temp 98.1°F | Ht 59.0 in | Wt 204.4 lb

## 2024-03-12 DIAGNOSIS — R911 Solitary pulmonary nodule: Secondary | ICD-10-CM | POA: Insufficient documentation

## 2024-03-12 DIAGNOSIS — Z87891 Personal history of nicotine dependence: Secondary | ICD-10-CM

## 2024-03-12 HISTORY — DX: Solitary pulmonary nodule: R91.1

## 2024-03-12 NOTE — Telephone Encounter (Signed)
 For the codes 68372, [id number], [id number], [id number], [id number], [id number], [phone] Prior Auth Not Required because the codes are not on the Prior Auth request list I spoke with Hankinson Refer [phone]

## 2024-03-12 NOTE — Progress Notes (Signed)
 "  Synopsis: Referred in for pulmonary nodule by Cyrena Mylar, MD Assessment & Plan  #Right lower lobe pulmonary nodule  Nodule Location: RLL Nodule Size: 16 mm Nodule Spiculation: No Associated Lymphadenopathy Smoking Status (former) and pack years: 20 Extrathoracic cancer > 5 years prior (no) SPN malignancy risk score [facility]): 59 %risk of malignancy ECOG: 0  The patient is here to discuss their imaging abnormalities which include FDG avid RLL pulmonary nodule, raising suspicion for malignancy. Per patient's report, she had a scan in the summer that was clear, but I am not yet able to review these images and have asked the patient to bring them in for review. Differential diagnosis includes malignancy and infectious process.   We discussed the importance of diagnosis and staging in lung malignancies, and the approach to obtaining a tissue diagnosis which would include robotic assisted navigational bronchoscopy with endobronchial ultrasound guided sampling.  We also discussed the risks associated with the procedure which include a 2% risk of pneumothorax, infection, bleeding, and nondiagnostic procedure in detail.  I explained that patients typically are able to return home the same day of the procedure, but in rare cases admission to the hospital for observation and treatment is required.  After our discussion, the patient elected to proceed with the procedure  - CT SUPER D CHEST WO CONTRAST; Future - Procedural/ Surgical Case Request: VIDEO BRONCHOSCOPY WITH ENDOBRONCHIAL NAVIGATION; Future - obtain previous CT imaging for review   Belva November, MD [facility]  I spent 45 minutes caring for this patient today, including preparing to see the patient, obtaining a medical history , reviewing a separately obtained history, performing a medically appropriate examination and/or evaluation, counseling and educating the patient/family/caregiver, ordering  medications, tests, or procedures, documenting clinical information in the electronic health record, and independently interpreting results (not separately reported/billed) and communicating results to the patient/family/caregiver  End of visit medications:  No orders of the defined types were placed in this encounter.   Current Medications[1]   Subjective:   PATIENT ID: Megan Hawkins GENDER: female DOB: 09/21/1970, MRN: [id number]  Chief Complaint  Patient presents with   Lung Mass    Nodule. No breathing problems.  Albuterol - PRN    HPI  Discussed the use of AI scribe software for clinical note transcription with the patient, who gave verbal consent to proceed.  History of Present Illness  Megan Hawkins is a 54 year old female who presents with a right lower lobe pulmonary nodule. She was referred for a biopsy of the nodule.  The right lower lobe pulmonary nodule was discovered following an ER visit for worsening shortness of breath that began in mid-November 2025. She was seen in the ED on 02/28/2024 where a CT scan ruled out PE but showed a RLL nodule. The patient was seen by oncology, and a PET/CT was ordered. This nodule was found to be FDG avid on a PET scan.  Her respiratory symptoms included shortness of breath, cough, and wheezing, with clear, milky white phlegm. She experienced chest tightness and pain, particularly on deep inspiration, radiating to her back. No fever, chills, night sweats, unintentional weight loss, or hemoptysis.  She has a history of asthma diagnosed at age 38 and uses albuterol  as needed. She was previously on Advair but discontinued it due to personal preference. Recently, she completed a short course of prednisone  for her respiratory symptoms.  Her past medical history includes degenerative disc disease and a history of back surgeries, including  a T11-T12 laminectomy and discectomy, followed by stabilization with pin and rod placement. No history of  bloodstream infections, clots, or catheter placements.  Social history reveals she is a former smoker, having quit nine days ago after smoking half a pack per day since age 28. 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 Her family history includes her father with congestive heart failure related to alcohol use and her mother with COPD, who is also a former smoker.   Ancillary information including prior medications, full medical/surgical/family/social histories, and PFTs (when available) are listed below and have been reviewed.    Review of Systems  Constitutional:  Negative for chills, fever, malaise/fatigue and weight loss.  Respiratory:  Negative for cough, hemoptysis, sputum production, shortness of breath and wheezing.   Cardiovascular:  Negative for chest pain.     Objective:   Vitals:   03/12/24 0905  BP: 122/82  Pulse: 87  Temp: 98.1 F (36.7 C)  SpO2: 97%  Weight: 204 lb 6.4 oz (92.7 kg)  Height: 4' 11 (1.499 m)   97% on RA BMI Readings from Last 3 Encounters:  03/12/24 41.28 kg/m  03/06/24 40.87 kg/m  03/04/24 42.95 kg/m   Wt Readings from Last 3 Encounters:  03/12/24 204 lb 6.4 oz (92.7 kg)  03/06/24 202 lb 6 oz (91.8 kg)  03/04/24 205 lb 8 oz (93.2 kg)    Physical Exam Constitutional:      Appearance: Normal appearance. She is obese.  Cardiovascular:     Rate and Rhythm: Normal rate and regular rhythm.     Pulses: Normal pulses.     Heart sounds: Normal heart sounds.  Pulmonary:     Effort: Pulmonary effort is normal. No respiratory distress.     Breath sounds: Normal breath sounds. No wheezing or rales.  Neurological:     General: No focal deficit present.     Mental Status: She is alert and oriented to person, place, and time. Mental status is at baseline.       Ancillary Information    Past Medical History:  Diagnosis Date   Allergy    Anxiety    Asthma    BRCA negative 01/2021   MyRisk neg except AXIN2 VUS;  IBIS=7.1%/riskscore=12.9%   Colitis    Diverticulitis    Family history of ovarian cancer    Frequent headaches    Seizures (HCC)      Family History  Problem Relation Age of Onset   Alcohol abuse Mother    Schizophrenia Father    Alcohol abuse Father    Heart failure Father    Ovarian cancer Paternal Aunt    Ovarian cancer Paternal Aunt    Drug abuse Daughter    Bipolar disorder Daughter    Personality disorder Daughter    Drug abuse Son      Past Surgical History:  Procedure Laterality Date   BACK SURGERY  11/2010   T11-T12, hardware placement   CESAREAN SECTION     COLONOSCOPY WITH PROPOFOL  N/A 07/07/2021   Procedure: COLONOSCOPY WITH PROPOFOL ;  Surgeon: Therisa Bi, MD;  Location: [facility] ENDOSCOPY;  Service: Gastroenterology;  Laterality: N/A;   LAMINECTOMY AND MICRODISCECTOMY THORACIC SPINE  10/2007   T11-T12   ULNAR NERVE REPAIR  2012    Social History   Socioeconomic History   Marital status: Married    Spouse name: Not on file   Number of children: 5   Years of education: Not on file  Highest education level: High school graduate  Occupational History   Not on file  Tobacco Use   Smoking status: Former    Current packs/day: 0.00    Average packs/day: 0.5 packs/day for 37.0 years (18.5 ttl pk-yrs)    Types: Cigarettes    Start date: 03/07/1987    Quit date: 03/02/2024    Years since quitting: 0.0    Passive exposure: Past   Smokeless tobacco: Never   Tobacco comments:    Started smoking at 54 years old    Smoked 0.5 PPD at her heaviest  Vaping Use   Vaping status: Never Used  Substance and Sexual Activity   Alcohol use: Not Currently   Drug use: Never   Sexual activity: Yes    Birth control/protection: Surgical, None  Other Topics Concern   Not on file  Social History Narrative   Not on file   Social Drivers of Health   Tobacco Use: Medium Risk (03/12/2024)   Patient History    Smoking Tobacco Use: Former    Smokeless Tobacco Use: Never     Passive Exposure: Past  Physicist, Medical Strain: Not on file  Food Insecurity: No Food Insecurity (03/04/2024)   Epic    Worried About Programme Researcher, Broadcasting/film/video in the Last Year: Never true    Ran Out of Food in the Last Year: Never true  Transportation Needs: No Transportation Needs (03/04/2024)   Epic    Lack of Transportation (Medical): No    Lack of Transportation (Non-Medical): No  Physical Activity: Not on file  Stress: Not on file  Social Connections: Not on file  Intimate Partner Violence: Not At Risk (03/04/2024)   Epic    Fear of Current or Ex-Partner: No    Emotionally Abused: No    Physically Abused: No    Sexually Abused: No  Depression (PHQ2-9): High Risk (03/04/2024)   Depression (PHQ2-9)    PHQ-2 Score: 15  Alcohol Screen: Low Risk (09/25/2022)   Alcohol Screen    Last Alcohol Screening Score (AUDIT): 1  Housing: Low Risk (03/04/2024)   Epic    Unable to Pay for Housing in the Last Year: No    Number of Times Moved in the Last Year: 0    Homeless in the Last Year: No  Utilities: Not At Risk (03/04/2024)   Epic    Threatened with loss of utilities: No  Recent Concern: Utilities - At Risk (03/04/2024)   Epic    Threatened with loss of utilities: Yes  Health Literacy: Not on file     Allergies[2]   CBC    Component Value Date/Time   WBC 13.1 (H) 02/28/2024 2139   RBC 5.21 (H) 02/28/2024 2139   HGB 15.4 (H) 02/28/2024 2139   HCT 46.0 02/28/2024 2139   PLT 283 02/28/2024 2139   MCV 88.3 02/28/2024 2139   MCH 29.6 02/28/2024 2139   MCHC 33.5 02/28/2024 2139   RDW 13.3 02/28/2024 2139   LYMPHSABS 2,552 03/22/2022 0807   EOSABS 357 03/22/2022 0807   BASOSABS 95 03/22/2022 0807    Pulmonary Functions Testing Results:     No data to display          Outpatient Medications Prior to Visit  Medication Sig Dispense Refill   AIRSUPRA 90-80 MCG/ACT AERO Inhale 1 puff into the lungs as needed.     ARIPiprazole  (ABILIFY ) 2 MG tablet Take 1 tablet (2 mg total) by mouth  in the morning. 30 tablet 1  cyclobenzaprine  (FLEXERIL ) 10 MG tablet Take 1 tablet (10 mg total) by mouth 3 (three) times daily. 90 tablet 5   DULoxetine  (CYMBALTA ) 30 MG capsule TAKE 1 CAPSULE(30 MG) BY MOUTH DAILY 90 capsule 0   EMGALITY 120 MG/ML SOAJ 1 mL every 28 (twenty-eight) days.     gabapentin  (NEURONTIN ) 600 MG tablet Take 600 mg by mouth 3 (three) times daily.     ibuprofen  (ADVIL ) 800 MG tablet Take 1 tablet (800 mg total) by mouth every 8 (eight) hours as needed for mild pain. 30 tablet 0   traZODone (DESYREL) 50 MG tablet Take 50 mg by mouth at bedtime as needed.     UBRELVY  100 MG TABS Take 100 mg by mouth as needed (migraine headache). May repeat 1 dose within 2 hours if headache not resolved or returns. Max dose in 24 hours is 2 tablets. 16 tablet 2   Vitamin D, Ergocalciferol, (DRISDOL) 1.25 MG (50000 UNIT) CAPS capsule Take 50,000 Units by mouth once a week.     albuterol  (VENTOLIN  HFA) 108 (90 Base) MCG/ACT inhaler Inhale 1-2 puffs into the lungs every 6 (six) hours as needed for wheezing or shortness of breath. 8 g 3   No facility-administered medications prior to visit.      [1]  Current Outpatient Medications:    AIRSUPRA 90-80 MCG/ACT AERO, Inhale 1 puff into the lungs as needed., Disp: , Rfl:    ARIPiprazole  (ABILIFY ) 2 MG tablet, Take 1 tablet (2 mg total) by mouth in the morning., Disp: 30 tablet, Rfl: 1   cyclobenzaprine  (FLEXERIL ) 10 MG tablet, Take 1 tablet (10 mg total) by mouth 3 (three) times daily., Disp: 90 tablet, Rfl: 5   DULoxetine  (CYMBALTA ) 30 MG capsule, TAKE 1 CAPSULE(30 MG) BY MOUTH DAILY, Disp: 90 capsule, Rfl: 0   EMGALITY 120 MG/ML SOAJ, 1 mL every 28 (twenty-eight) days., Disp: , Rfl:    gabapentin  (NEURONTIN ) 600 MG tablet, Take 600 mg by mouth 3 (three) times daily., Disp: , Rfl:    ibuprofen  (ADVIL ) 800 MG tablet, Take 1 tablet (800 mg total) by mouth every 8 (eight) hours as needed for mild pain., Disp: 30 tablet, Rfl: 0   traZODone  (DESYREL) 50 MG tablet, Take 50 mg by mouth at bedtime as needed., Disp: , Rfl:    UBRELVY  100 MG TABS, Take 100 mg by mouth as needed (migraine headache). May repeat 1 dose within 2 hours if headache not resolved or returns. Max dose in 24 hours is 2 tablets., Disp: 16 tablet, Rfl: 2   Vitamin D, Ergocalciferol, (DRISDOL) 1.25 MG (50000 UNIT) CAPS capsule, Take 50,000 Units by mouth once a week., Disp: , Rfl:  [2]  Allergies Allergen Reactions   Sulfa Antibiotics Other (See Comments)    Syncope    "

## 2024-03-12 NOTE — Telephone Encounter (Signed)
 Noted. Nothing further needed.

## 2024-03-12 NOTE — Patient Instructions (Signed)
" °  VISIT SUMMARY: During your visit, we discussed the right lower lobe pulmonary nodule that was discovered following your ER visit for shortness of breath. We also reviewed your history of asthma and recent respiratory symptoms. We discussed the plan for further evaluation of the nodule and addressed your history of emphysema.  YOUR PLAN: -RIGHT LOWER LOBE PULMONARY NODULE: A pulmonary nodule is a small, round growth in the lung that can be benign or malignant. The nodule in your right lower lobe is suspicious for malignancy due to its FDG avidity on the PET scan. We have scheduled a bronchoscopic biopsy for next week to obtain a tissue sample and confirm the diagnosis. This procedure has a lower risk of complications compared to a percutaneous biopsy. We will also perform an endobronchial ultrasound (EBUS) during the biopsy to check for any lymph node involvement. A CT scan has been ordered to help with the procedural planning and navigation.  -EMPHYSEMA: Emphysema is a condition where the air sacs in the lungs are damaged, leading to difficulty in breathing. This is often caused by long-term smoking. It is important to continue avoiding smoking to prevent further damage to your lungs.  INSTRUCTIONS: Please follow up with the scheduled bronchoscopic biopsy next week. Please try to get the CT scan you had back in the summer for our review. Ensure you attend the CT scan appointment for procedural planning. Continue to avoid smoking to help manage your emphysema.                      Contains text generated by Abridge.                                 Contains text generated by Abridge.   "

## 2024-03-12 NOTE — Telephone Encounter (Signed)
 Robotic Bronch with EBUS 03/18/2024 at 9:00am Lung Nodule [id number], [id number], [id number], [id number], [id number], [id number], [id number]  Megan Hawkins please see bronch info.  Patient is aware and bronch email has been sent.

## 2024-03-14 ENCOUNTER — Encounter
Admission: RE | Admit: 2024-03-14 | Discharge: 2024-03-14 | Disposition: A | Discharge status: Home / Self Care | Payer: MEDICAID | Source: Ambulatory Visit | Attending: Student in an Organized Health Care Education/Training Program | Admitting: Student in an Organized Health Care Education/Training Program

## 2024-03-14 ENCOUNTER — Other Ambulatory Visit: Payer: Self-pay

## 2024-03-14 VITALS — Ht 59.0 in | Wt 202.8 lb

## 2024-03-14 DIAGNOSIS — Z01812 Encounter for preprocedural laboratory examination: Secondary | ICD-10-CM

## 2024-03-14 HISTORY — DX: Unspecified psychosis not due to a substance or known physiological condition: F29

## 2024-03-14 HISTORY — DX: Prediabetes: R73.03

## 2024-03-14 HISTORY — DX: Depression, unspecified: F32.A

## 2024-03-14 HISTORY — DX: Fibromyalgia: M79.7

## 2024-03-14 NOTE — Patient Instructions (Addendum)
 Your procedure is scheduled on:  TUESDAY JANUARY 27  Report to the Registration Desk on the 1st floor of the [facility] To find out your arrival time, please call [phone] between 1PM - 3PM on:  MONDAY JANUARY 26 If your arrival time is 6:00 am, do not arrive before that time as the [facility] entrance doors do not open until 6:00 am.  REMEMBER: Instructions that are not followed completely may result in serious medical risk, up to and including death; or upon the discretion of your surgeon and anesthesiologist your surgery may need to be rescheduled.  Do not eat food after midnight the night before surgery.  No gum chewing or hard candies.  You may however, drink CLEAR liquids up to 2 hours before you are scheduled to arrive for your surgery. Do not drink anything within 2 hours of your scheduled arrival time.  Clear liquids include: - water  - apple juice without pulp - gatorade (not RED colors) - black coffee or tea (Do NOT add milk or creamers to the coffee or tea) Do NOT drink anything that is not on this list.  One week prior to surgery: Stop Anti-inflammatories (NSAIDS) such as Advil , Aleve, Ibuprofen , Motrin , Naproxen, Naprosyn and Aspirin based products such as Excedrin, Goody's Powder, BC Powder. Stop ANY OVER THE COUNTER supplements until after surgery. Vitamin D,   You may however, continue to take Tylenol  if needed for pain up until the day of surgery.  Continue taking all of your other prescription medications up until the day of surgery.  ON THE DAY OF SURGERY ONLY TAKE THESE MEDICATIONS WITH SIPS OF WATER:  ARIPiprazole  (ABILIFY )  cyclobenzaprine  (FLEXERIL )  DULoxetine  (CYMBALTA )   Use inhalers on the day of surgery and bring to the hospital. AIRSUPRA   No Alcohol for 24 hours before or after surgery.  Do not use any recreational drugs for at least a week (preferably 2 weeks) before your surgery.  Please be advised that the combination of cocaine  and anesthesia may have negative outcomes, up to and including death. If you test positive for cocaine, your surgery will be cancelled.  On the morning of surgery brush your teeth with toothpaste and water, you may rinse your mouth with mouthwash if you wish. Do not swallow any toothpaste or mouthwash.  Do not wear jewelry, make-up, hairpins, clips or nail polish.  For welded (permanent) jewelry: bracelets, anklets, waist bands, etc.  Please have this removed prior to surgery.  If it is not removed, there is a chance that hospital personnel will need to cut it off on the day of surgery.  Do not wear lotions, powders, or perfumes.   Do not shave body hair from the neck down 48 hours before surgery.  Contact lenses, hearing aids and dentures may not be worn into surgery.  Do not bring valuables to the hospital. [facility] is not responsible for any missing/lost belongings or valuables.   Notify your doctor if there is any change in your medical condition (cold, fever, infection).  Wear comfortable clothing (specific to your surgery type) to the hospital.  After surgery, you can help prevent lung complications by doing breathing exercises.  Take deep breaths and cough every 1-2 hours.   If you are being discharged the day of surgery, you will not be allowed to drive home. You will need a responsible individual to drive you home and stay with you for 24 hours after surgery.   If you are taking  public transportation, you will need to have a responsible individual with you.  Please call the Pre-admissions Testing Dept. at [phone] if you have any questions about these instructions.  Surgery Visitation Policy:  Patients having surgery or a procedure may have two visitors.  Children under the age of 55 must have an adult with them who is not the patient.  Merchandiser, Retail to address health-related social needs:  [email or web address]

## 2024-03-17 ENCOUNTER — Other Ambulatory Visit: Payer: Self-pay

## 2024-03-17 ENCOUNTER — Ambulatory Visit: Payer: MEDICAID

## 2024-03-17 DIAGNOSIS — R911 Solitary pulmonary nodule: Secondary | ICD-10-CM

## 2024-03-18 ENCOUNTER — Encounter
Admission: RE | Disposition: A | Payer: Self-pay | Source: Home / Self Care | Attending: Student in an Organized Health Care Education/Training Program

## 2024-03-18 ENCOUNTER — Encounter: Payer: MEDICAID | Admitting: Urgent Care

## 2024-03-18 ENCOUNTER — Ambulatory Visit
Admission: RE | Admit: 2024-03-18 | Discharge: 2024-03-18 | Disposition: A | Discharge status: Home / Self Care | Payer: MEDICAID | Attending: Student in an Organized Health Care Education/Training Program | Admitting: Student in an Organized Health Care Education/Training Program

## 2024-03-18 ENCOUNTER — Encounter: Payer: Self-pay | Admitting: Student in an Organized Health Care Education/Training Program

## 2024-03-18 ENCOUNTER — Other Ambulatory Visit: Payer: Self-pay

## 2024-03-18 DIAGNOSIS — Z01812 Encounter for preprocedural laboratory examination: Secondary | ICD-10-CM

## 2024-03-18 DIAGNOSIS — Z538 Procedure and treatment not carried out for other reasons: Secondary | ICD-10-CM | POA: Diagnosis not present

## 2024-03-18 DIAGNOSIS — I272 Pulmonary hypertension, unspecified: Secondary | ICD-10-CM | POA: Insufficient documentation

## 2024-03-18 DIAGNOSIS — R911 Solitary pulmonary nodule: Secondary | ICD-10-CM | POA: Diagnosis present

## 2024-03-18 SURGERY — VIDEO BRONCHOSCOPY WITH ENDOBRONCHIAL NAVIGATION
Anesthesia: General | Laterality: Bilateral

## 2024-03-18 MED ORDER — ROCURONIUM BROMIDE 10 MG/ML (PF) SYRINGE
PREFILLED_SYRINGE | INTRAVENOUS | Status: AC
  Filled 2024-03-18: qty 10

## 2024-03-18 MED ORDER — FENTANYL CITRATE (PF) 100 MCG/2ML IJ SOLN
INTRAMUSCULAR | Status: AC
  Filled 2024-03-18: qty 2

## 2024-03-18 MED ORDER — CHLORHEXIDINE GLUCONATE 0.12 % MT SOLN
OROMUCOSAL | Status: AC
  Filled 2024-03-18: qty 15

## 2024-03-18 MED ORDER — ORAL CARE MOUTH RINSE: 15.0000 mL | Freq: Once | OROMUCOSAL | Status: AC

## 2024-03-18 MED ORDER — CHLORHEXIDINE GLUCONATE 0.12 % MT SOLN
15.0000 mL | Freq: Once | OROMUCOSAL | Status: AC
  Administered 2024-03-18: 15 mL via OROMUCOSAL

## 2024-03-18 MED ORDER — PROPOFOL 1000 MG/100ML IV EMUL
INTRAVENOUS | Status: AC
  Filled 2024-03-18: qty 100

## 2024-03-18 MED ORDER — MIDAZOLAM HCL 2 MG/2ML IJ SOLN
INTRAMUSCULAR | Status: AC
  Filled 2024-03-18: qty 2

## 2024-03-18 MED ORDER — LACTATED RINGERS IV SOLN: INTRAVENOUS | Status: DC

## 2024-03-18 MED ORDER — LIDOCAINE HCL (PF) 2 % IJ SOLN
INTRAMUSCULAR | Status: AC
  Filled 2024-03-18: qty 5

## 2024-03-18 MED ORDER — DEXMEDETOMIDINE HCL IN NACL 80 MCG/20ML IV SOLN
INTRAVENOUS | Status: AC
  Filled 2024-03-18: qty 20

## 2024-03-18 MED ORDER — PROPOFOL 10 MG/ML IV BOLUS
INTRAVENOUS | Status: AC
  Filled 2024-03-18: qty 20

## 2024-03-18 NOTE — Progress Notes (Signed)
 Brief Progress Note:  Informed by anesthesia team that patient will require cardiac clearance prior to undergoing general anesthesia, with main concern being that of pulmonary hypertension. Awaiting echocardiogram scheduled for 1/27. Will hold off on rescheduling the procedure until cleared by cardiology.   We will obtain a repeat CT chest prior to her repeat procedure to re-evaluate the nodule. This was explained to the patient and her partner at bedside.  Belva November, MD [facility] 03/18/2024 9:24 AM

## 2024-03-25 ENCOUNTER — Ambulatory Visit: Payer: MEDICAID

## 2024-03-25 DIAGNOSIS — Q211 Atrial septal defect, unspecified: Secondary | ICD-10-CM | POA: Insufficient documentation

## 2024-03-25 LAB — ECHOCARDIOGRAM COMPLETE BUBBLE STUDY
AR max vel: 2.68 cm2
AV Area VTI: 2.57 cm2
AV Area mean vel: 2.67 cm2
AV Mean grad: 3 mmHg
AV Peak grad: 6.1 mmHg
Ao pk vel: 1.23 m/s
Area-P 1/2: 4.06 cm2
S' Lateral: 3.6 cm

## 2024-03-26 ENCOUNTER — Inpatient Hospital Stay: Payer: MEDICAID | Admitting: Oncology

## 2024-03-27 ENCOUNTER — Ambulatory Visit: Payer: Self-pay | Admitting: Internal Medicine

## 2024-03-27 ENCOUNTER — Ambulatory Visit: Payer: MEDICAID | Admitting: Physician Assistant

## 2024-03-27 ENCOUNTER — Other Ambulatory Visit: Payer: Self-pay | Admitting: Student in an Organized Health Care Education/Training Program

## 2024-03-27 ENCOUNTER — Inpatient Hospital Stay
Admission: RE | Admit: 2024-03-27 | Discharge: 2024-03-27 | Disposition: A | Discharge status: Home / Self Care | Payer: Self-pay | Source: Ambulatory Visit | Attending: Student in an Organized Health Care Education/Training Program | Admitting: Student in an Organized Health Care Education/Training Program

## 2024-03-27 DIAGNOSIS — R911 Solitary pulmonary nodule: Secondary | ICD-10-CM

## 2024-03-28 ENCOUNTER — Telehealth: Payer: Self-pay

## 2024-03-28 NOTE — Telephone Encounter (Signed)
 Noted. Nothing further needed.

## 2024-03-28 NOTE — Telephone Encounter (Signed)
 CT Chest on 10/15/2023 from [facility] has been uploaded into her chart for you to review.

## 2024-03-31 ENCOUNTER — Ambulatory Visit: Payer: MEDICAID | Admitting: Student in an Organized Health Care Education/Training Program

## 2024-03-31 ENCOUNTER — Ambulatory Visit: Payer: MEDICAID

## 2024-03-31 ENCOUNTER — Ambulatory Visit: Payer: MEDICAID | Admitting: Cardiology

## 2024-03-31 ENCOUNTER — Encounter: Payer: Self-pay | Admitting: Cardiology

## 2024-03-31 VITALS — BP 129/89 | HR 97 | Ht 59.0 in | Wt 208.0 lb

## 2024-03-31 DIAGNOSIS — R002 Palpitations: Secondary | ICD-10-CM

## 2024-03-31 DIAGNOSIS — R000 Tachycardia, unspecified: Secondary | ICD-10-CM

## 2024-03-31 DIAGNOSIS — I351 Nonrheumatic aortic (valve) insufficiency: Secondary | ICD-10-CM

## 2024-03-31 DIAGNOSIS — R918 Other nonspecific abnormal finding of lung field: Secondary | ICD-10-CM

## 2024-03-31 DIAGNOSIS — I340 Nonrheumatic mitral (valve) insufficiency: Secondary | ICD-10-CM | POA: Diagnosis not present

## 2024-03-31 DIAGNOSIS — R0602 Shortness of breath: Secondary | ICD-10-CM

## 2024-03-31 NOTE — Progress Notes (Signed)
 " Cardiology Office Note   Date:  03/31/2024  ID:  Megan Hawkins, DOB 1970-05-15, MRN [id number] PCP: Orion Kerns, MD  [facility] Providers Cardiologist:  Lonni Hanson, MD Cardiology APP:  Gerard Frederick, NP     History of Present Illness Megan Hawkins is a 55 y.o. female with past medical history of asthma, childhood seizures, frequent headaches, diverticulitis, degenerative disc disease, and tobacco use, who is here today for follow-up.   He was previously evaluated in the [facility] emergency department 02/28/2024 for worsening shortness of breath and cough over the past 3 weeks.  Husband noted that she sounded wheezy and was struggling to breathe.  She was noted to be mildly tachycardic.  Underwent CT of the chest for evaluation of lung opacity on chest x-ray and the scan was concerning for pleural-based micro lobulated pulmonary nodule measuring 1.6 x 1.6 cm in the right lobe concerning for malignancy.  Moderate emphysematous changes were noted as well as possible ASD.  She was last seen in clinic 03/06/2024 by Dr. Hanson.  She started feeling better with improvement in her shortness of breath.  She was referred to PET/CT and pulmonary consultation and to see Dr. Jacobo at the cancer center.  She noted sporadic palpitations and chest pain in the past.  Had 2 urgent care visit approximately 2 years ago with the symptoms.  She was referred to the because she was told she may be having a heart attack.  It did not.  That any cardiac testing was performed at that time.  She has not had any recent chest pain but still has occasional skipped beats can bring on transient tightness in her chest.  For concerns of the possibility of an ASD she was scheduled for an echocardiogram with a bubble study.  Based on the results and symptoms, further further evaluation with a gated cardiac CTA and a cardiac MRI will need to be considered.   She returns to clinic today accompanied by her husband.  She denies  any chest pain, chest pressure, or worsening shortness of breath. She states that she continues to have palpitations that happen most often with rest or that is when she notices them and her heart may beat harder and then that causes a tightening in her chest. She has noticed that she has swelling around the top of her socks and in her hands.  Her husband seems very anxious and has multiple questions about the previous echocardiogram that she had completed.  The patient is also requesting clearance if she can have her bronchoscopy for suspicious lung mass.  States that she has been compliant with her current medication regimen.  Denies any recent hospitalizations or visits to the emergency department.  ROS: 10 point review of system has been reviewed and considered negative the exception was been listed in the HPI  Studies Reviewed     2d echo 03/25/2024 1. Left ventricular ejection fraction, by estimation, is 60 to 65%. Left  ventricular ejection fraction by 3D volume is 57 %. The left ventricle has  normal function. The left ventricle has no regional wall motion  abnormalities. Left ventricular diastolic   parameters are consistent with Grade I diastolic dysfunction (impaired  relaxation). The average left ventricular global longitudinal strain is  -16.5 %. The global longitudinal strain is normal.   2. Right ventricular systolic function is normal. The right ventricular  size is normal. Tricuspid regurgitation signal is inadequate for assessing  PA pressure.   3. The mitral  valve is normal in structure. Mild mitral valve  regurgitation. No evidence of mitral stenosis.   4. The aortic valve is normal in structure. Aortic valve regurgitation is  mild to moderate. No aortic stenosis is present.   5. The inferior vena cava is normal in size with greater than 50%  respiratory variability, suggesting right atrial pressure of 3 mmHg.   6. Agitated saline contrast bubble study was negative, with no  evidence  of any interatrial shunt.   Risk Assessment/Calculations         Physical Exam VS:  BP 129/89 (BP Location: Right Wrist, Patient Position: Sitting, Cuff Size: Normal)   Pulse 97   Ht 4' 11 (1.499 m)   Wt 208 lb (94.3 kg)   SpO2 98%   BMI 42.01 kg/m        Wt Readings from Last 3 Encounters:  03/31/24 208 lb (94.3 kg)  03/18/24 201 lb (91.2 kg)  03/14/24 202 lb 13.2 oz (92 kg)    GEN: Well nourished, well developed in no acute distress NECK: No JVD; No carotid bruits CARDIAC: RRR, no murmurs, rubs, gallops RESPIRATORY:  Clear to auscultation without rales, wheezing or rhonchi  ABDOMEN: Soft, non-tender, non-distended EXTREMITIES: Trace pretibial edema; No deformity   ASSESSMENT AND PLAN Lung mass suspicious for bronchogenic carcinoma with associated shortness of breath likely secondary to COPD and recently discovered lung mass.  She recently underwent an echocardiogram with previous CT scan showing concerns of the possibility of an ASD.  Echocardiogram revealed an LVEF of 60 to 65%, no wall wall motion abnormalities were noted, there was grade 1 diastolic dysfunction, mild mitral regurgitation and mild to moderate aortic valve regurgitation.  With the shortness of breath improving with previous treatment and no ASD noted as a bubble study was negative.  No gated cardiac CTA or cardiac MRI will be ordered at this time.  She has been advised that she will need surveillance studies to reevaluate her valvular function every several years.  Sinus tachycardia with palpitations that have been ongoing.  She states that palpitations have been this way for several years.  Heart rate today of 97.  With ongoing complaints of palpitations she has been placed on a ZIO XT monitor to rule out arrhythmia.   Mild mitral regurgitation and mild to moderate aortic regurgitation noted on previous echocardiogram.  Will require surveillance studies.  Preoperative cardiovascular examination     Megan Hawkins perioperative risk of a major cardiac event is 0.9% according to the Revised Cardiac Risk Index (RCRI).  Therefore, she is at low risk for perioperative complications.   Her functional capacity is good at 5.72 METs according to the Duke Activity Status Index (DASI). Recommendations: According to ACC/AHA guidelines, no further cardiovascular testing needed.  The patient may proceed to surgery at acceptable risk.          Dispo: Patient to return to clinic to see MD/APP in 6 to 8 weeks or sooner if needed for further evaluation  Signed, Tabbitha Janvrin, NP   "

## 2024-04-02 ENCOUNTER — Encounter: Payer: Self-pay | Admitting: Student in an Organized Health Care Education/Training Program

## 2024-04-02 ENCOUNTER — Ambulatory Visit: Payer: MEDICAID | Admitting: Student in an Organized Health Care Education/Training Program

## 2024-04-02 ENCOUNTER — Inpatient Hospital Stay: Payer: MEDICAID

## 2024-04-02 ENCOUNTER — Telehealth: Payer: Self-pay

## 2024-04-02 VITALS — BP 96/76 | HR 83 | Temp 97.9°F | Ht 59.0 in | Wt 207.4 lb

## 2024-04-02 DIAGNOSIS — R911 Solitary pulmonary nodule: Secondary | ICD-10-CM | POA: Diagnosis not present

## 2024-04-02 DIAGNOSIS — Z87891 Personal history of nicotine dependence: Secondary | ICD-10-CM

## 2024-04-02 NOTE — Progress Notes (Unsigned)
 "  Synopsis: Referred in *** by Orion Kerns, MD  Assessment & Plan:   1. Lung nodule (Primary) *** - CT SUPER D CHEST WO CONTRAST; Future - Procedural/ Surgical Case Request: VIDEO BRONCHOSCOPY WITH ENDOBRONCHIAL NAVIGATION; Future  Assessment and Plan Assessment & Plan Pulmonary nodule   A nodule in the lower right lung remains unchanged in size between August and January CT scans but shows metabolic activity on a PET scan. The previous CT scan was reportedly negative, raising concerns about radiological oversight. Cardiac clearance is obtained for a biopsy. She is scheduled for a robotic bronchoscopy with Dr. Sanna next Tuesday. A low-dose CT scan is ordered to map the airway before the procedure. She should discuss the previous CT scan oversight with her primary care physician.   No follow-ups on file.  Belva November, MD [facility] 04/02/2024 4:58 PM   I spent *** minutes caring for this patient today, including {EM billing:28027}  End of visit medications:  No orders of the defined types were placed in this encounter.   Current Medications[1]   Subjective:   PATIENT ID: Megan Hawkins GENDER: female DOB: 1970-05-03, MRN: [id number]  Chief Complaint  Patient presents with   Lung Mass    Shortness of breath on exertion and wheezing. No cough.     HPI  Discussed the use of AI scribe software for clinical note transcription with the patient, who gave verbal consent to proceed.  History of Present Illness Megan Hawkins is a 54 year old female who presents for follow-up of a pulmonary nodule noted on a CT scan.  She was initially scheduled for a robotic bronchoscopy to biopsy the nodule, but the procedure was canceled due to the lack of cardiac clearance. She has since met with a cardiologist who has cleared her for the procedure.  The pulmonary nodule is located in the lower portion of her right lung. There is some confusion regarding previous  imaging, as a recent low-dose CT scan reportedly showed no abnormalities, despite the nodule being present in prior scans. The nodule appears to be the same size as before, but due to differences in imaging techniques, it is difficult to determine if there has been any growth. The nodule is active on a PET scan.  She is concerned about the delay in diagnosis and the potential for the nodule to grow. She mentions that the nodule was missed in a previous CT scan report, which was sent to her primary care physician. She plans to discuss this oversight with her primary care physician to ensure the radiology service is aware of the error.  She has a history of smoking, which she acknowledges could have contributed to her current condition.   Ancillary information including prior medications, full medical/surgical/family/social histories, and PFTs (when available) are listed below and have been reviewed.   {PULM QUESTIONNAIRES (Optional):33196}  ROS   Objective:   Vitals:   04/02/24 1329  BP: 96/76  Pulse: 83  Temp: 97.9 F (36.6 C)  TempSrc: Temporal  SpO2: 99%  Weight: 207 lb 6.4 oz (94.1 kg)  Height: 4' 11 (1.499 m)   99% on *** LPM *** RA BMI Readings from Last 3 Encounters:  04/02/24 41.89 kg/m  03/31/24 42.01 kg/m  03/18/24 40.60 kg/m   Wt Readings from Last 3 Encounters:  04/02/24 207 lb 6.4 oz (94.1 kg)  03/31/24 208 lb (94.3 kg)  03/18/24 201 lb (91.2 kg)    .vitalsmbmi  Physical Exam  Ancillary Information    Past Medical History:  Diagnosis Date   Allergy    Anxiety    ASD (atrial septal defect) 03/06/2024   Asthma    At risk for prolonged QT interval syndrome 2024   BRCA negative 01/2021   MyRisk neg except AXIN2 VUS; IBIS=7.1%/riskscore=12.9%   Cervical spinal stenosis 2022   Colitis    DDD (degenerative disc disease), cervical 2022   DDD (degenerative disc disease), lumbar 2022   Depression    Diverticulitis    Epidermoid cyst of skin of  scalp 2023   Family history of ovarian cancer    Fibromyalgia    Frequent headaches    Lung mass 03/06/2024   Lung nodule 03/12/2024   Palpitations 03/06/2024   Pre-diabetes    Psychosis (HCC)    Pure hypercholesterolemia 2023   Seizures (HCC)    Sinus tachycardia 03/06/2024   Trauma and stressor-related disorder 2024     Family History  Problem Relation Age of Onset   Alcohol abuse Mother    Schizophrenia Father    Alcohol abuse Father    Heart failure Father    Ovarian cancer Paternal Aunt    Ovarian cancer Paternal Aunt    Drug abuse Daughter    Bipolar disorder Daughter    Personality disorder Daughter    Drug abuse Son      Past Surgical History:  Procedure Laterality Date   BACK SURGERY  11/2010   T11-T12, hardware placement   CESAREAN SECTION     COLONOSCOPY WITH PROPOFOL  N/A 07/07/2021   Procedure: COLONOSCOPY WITH PROPOFOL ;  Surgeon: Therisa Bi, MD;  Location: [facility] ENDOSCOPY;  Service: Gastroenterology;  Laterality: N/A;   LAMINECTOMY AND MICRODISCECTOMY THORACIC SPINE  10/2007   T11-T12   ULNAR NERVE REPAIR  2012    Social History   Socioeconomic History   Marital status: Married    Spouse name: Garrel   Number of children: 5   Years of education: Not on file   Highest education level: High school graduate  Occupational History   Not on file  Tobacco Use   Smoking status: Former    Current packs/day: 0.00    Average packs/day: 0.5 packs/day for 37.0 years (18.5 ttl pk-yrs)    Types: Cigarettes    Start date: 03/07/1987    Quit date: 03/02/2024    Years since quitting: 0.0    Passive exposure: Past   Smokeless tobacco: Never   Tobacco comments:    Started smoking at 54 years old    Smoked 0.5 PPD at her heaviest  Vaping Use   Vaping status: Never Used  Substance and Sexual Activity   Alcohol use: Not Currently   Drug use: Never   Sexual activity: Yes    Birth control/protection: Surgical, None  Other Topics Concern   Not on file  Social  History Narrative   Not on file   Social Drivers of Health   Tobacco Use: Medium Risk (04/02/2024)   Patient History    Smoking Tobacco Use: Former    Smokeless Tobacco Use: Never    Passive Exposure: Past  Physicist, Medical Strain: Not on file  Food Insecurity: No Food Insecurity (03/04/2024)   Epic    Worried About Programme Researcher, Broadcasting/film/video in the Last Year: Never true    Ran Out of Food in the Last Year: Never true  Transportation Needs: No Transportation Needs (03/04/2024)   Epic    Lack of Transportation (Medical): No  Lack of Transportation (Non-Medical): No  Physical Activity: Not on file  Stress: Not on file  Social Connections: Not on file  Intimate Partner Violence: Not At Risk (03/04/2024)   Epic    Fear of Current or Ex-Partner: No    Emotionally Abused: No    Physically Abused: No    Sexually Abused: No  Depression (PHQ2-9): High Risk (03/04/2024)   Depression (PHQ2-9)    PHQ-2 Score: 15  Alcohol Screen: Low Risk (09/25/2022)   Alcohol Screen    Last Alcohol Screening Score (AUDIT): 1  Housing: Low Risk (03/04/2024)   Epic    Unable to Pay for Housing in the Last Year: No    Number of Times Moved in the Last Year: 0    Homeless in the Last Year: No  Utilities: Not At Risk (03/04/2024)   Epic    Threatened with loss of utilities: No  Recent Concern: Utilities - At Risk (03/04/2024)   Epic    Threatened with loss of utilities: Yes  Health Literacy: Not on file     Allergies[2]   CBC    Component Value Date/Time   WBC 13.1 (H) 02/28/2024 2139   RBC 5.21 (H) 02/28/2024 2139   HGB 15.4 (H) 02/28/2024 2139   HCT 46.0 02/28/2024 2139   PLT 283 02/28/2024 2139   MCV 88.3 02/28/2024 2139   MCH 29.6 02/28/2024 2139   MCHC 33.5 02/28/2024 2139   RDW 13.3 02/28/2024 2139   LYMPHSABS 2,552 03/22/2022 0807   EOSABS 357 03/22/2022 0807   BASOSABS 95 03/22/2022 0807    Pulmonary Functions Testing Results:     No data to display          Outpatient Medications Prior  to Visit  Medication Sig Dispense Refill   AIRSUPRA 90-80 MCG/ACT AERO Inhale 1 puff into the lungs as needed.     ARIPiprazole  (ABILIFY ) 2 MG tablet Take 1 tablet (2 mg total) by mouth in the morning. 30 tablet 1   cyclobenzaprine  (FLEXERIL ) 10 MG tablet Take 1 tablet (10 mg total) by mouth 3 (three) times daily. 90 tablet 5   DULoxetine  (CYMBALTA ) 30 MG capsule TAKE 1 CAPSULE(30 MG) BY MOUTH DAILY 90 capsule 0   EMGALITY 120 MG/ML SOAJ 1 mL every 28 (twenty-eight) days.     gabapentin  (NEURONTIN ) 600 MG tablet Take 600 mg by mouth 3 (three) times daily.     ibuprofen  (ADVIL ) 800 MG tablet Take 1 tablet (800 mg total) by mouth every 8 (eight) hours as needed for mild pain. 30 tablet 0   traZODone (DESYREL) 50 MG tablet Take 50 mg by mouth at bedtime as needed.     UBRELVY  100 MG TABS Take 100 mg by mouth as needed (migraine headache). May repeat 1 dose within 2 hours if headache not resolved or returns. Max dose in 24 hours is 2 tablets. 16 tablet 2   Vitamin D, Ergocalciferol, (DRISDOL) 1.25 MG (50000 UNIT) CAPS capsule Take 50,000 Units by mouth once a week.     No facility-administered medications prior to visit.      [1]  Current Outpatient Medications:    AIRSUPRA 90-80 MCG/ACT AERO, Inhale 1 puff into the lungs as needed., Disp: , Rfl:    ARIPiprazole  (ABILIFY ) 2 MG tablet, Take 1 tablet (2 mg total) by mouth in the morning., Disp: 30 tablet, Rfl: 1   cyclobenzaprine  (FLEXERIL ) 10 MG tablet, Take 1 tablet (10 mg total) by mouth 3 (three) times daily., Disp: 90  tablet, Rfl: 5   DULoxetine  (CYMBALTA ) 30 MG capsule, TAKE 1 CAPSULE(30 MG) BY MOUTH DAILY, Disp: 90 capsule, Rfl: 0   EMGALITY 120 MG/ML SOAJ, 1 mL every 28 (twenty-eight) days., Disp: , Rfl:    gabapentin  (NEURONTIN ) 600 MG tablet, Take 600 mg by mouth 3 (three) times daily., Disp: , Rfl:    ibuprofen  (ADVIL ) 800 MG tablet, Take 1 tablet (800 mg total) by mouth every 8 (eight) hours as needed for mild pain., Disp: 30 tablet,  Rfl: 0   traZODone (DESYREL) 50 MG tablet, Take 50 mg by mouth at bedtime as needed., Disp: , Rfl:    UBRELVY  100 MG TABS, Take 100 mg by mouth as needed (migraine headache). May repeat 1 dose within 2 hours if headache not resolved or returns. Max dose in 24 hours is 2 tablets., Disp: 16 tablet, Rfl: 2   Vitamin D, Ergocalciferol, (DRISDOL) 1.25 MG (50000 UNIT) CAPS capsule, Take 50,000 Units by mouth once a week., Disp: , Rfl:  [2]  Allergies Allergen Reactions   Sulfa Antibiotics Other (See Comments)    Syncope    "

## 2024-04-02 NOTE — Telephone Encounter (Signed)
 For the codes 68372, [id number], [id number], [id number], [id number], [id number], [phone] Prior Auth Not Required because the codes are not on the Prior Auth request list I spoke with Hankinson Refer [phone]

## 2024-04-02 NOTE — Telephone Encounter (Signed)
 Robotic bronch w EBUS 04/08/2024  1:00 pm R 91.1 CPT Code: 68372, [id number], [id number], [id number], [id number], [id number], 68371  Donzell please see Bronch info.

## 2024-04-02 NOTE — Telephone Encounter (Signed)
 Noted.

## 2024-04-03 ENCOUNTER — Telehealth: Payer: Self-pay | Admitting: Student in an Organized Health Care Education/Training Program

## 2024-04-03 NOTE — Telephone Encounter (Signed)
 Dr. Isadora the insurance has denied this CT again. Do you want to try to do peer to peer again. Let me know when you are available

## 2024-04-04 NOTE — Telephone Encounter (Signed)
 When should I try to schedule the peer to peer for you

## 2024-04-08 ENCOUNTER — Encounter: Admission: RE | Payer: Self-pay | Source: Home / Self Care

## 2024-04-08 ENCOUNTER — Ambulatory Visit: Admission: RE | Admit: 2024-04-08 | Payer: MEDICAID | Source: Home / Self Care | Admitting: Pulmonary Disease

## 2024-04-08 ENCOUNTER — Ambulatory Visit: Payer: MEDICAID

## 2024-04-08 DIAGNOSIS — R911 Solitary pulmonary nodule: Secondary | ICD-10-CM | POA: Insufficient documentation

## 2024-04-16 ENCOUNTER — Inpatient Hospital Stay: Payer: MEDICAID | Admitting: Oncology

## 2024-04-30 ENCOUNTER — Inpatient Hospital Stay: Payer: MEDICAID

## 2024-05-21 ENCOUNTER — Ambulatory Visit: Payer: MEDICAID | Admitting: Internal Medicine
